# Patient Record
Sex: Female | Born: 1970 | Race: Black or African American | Hispanic: No | Marital: Single
Health system: Midwestern US, Academic
[De-identification: ages and names within clinical notes are randomized; demographics above are authoritative.]

---

## 2017-01-14 ENCOUNTER — Encounter: Admit: 2017-01-14 | Discharge: 2017-01-15

## 2017-04-20 ENCOUNTER — Encounter: Admit: 2017-04-20 | Discharge: 2017-04-20 | Payer: PRIVATE HEALTH INSURANCE

## 2017-04-20 ENCOUNTER — Ambulatory Visit: Admit: 2017-04-20 | Discharge: 2017-04-20 | Payer: MEDICAID

## 2017-04-20 DIAGNOSIS — K319 Disease of stomach and duodenum, unspecified: Principal | ICD-10-CM

## 2017-04-20 DIAGNOSIS — K5900 Constipation, unspecified: Principal | ICD-10-CM

## 2017-04-20 DIAGNOSIS — M6289 Other specified disorders of muscle: ICD-10-CM

## 2017-04-20 DIAGNOSIS — M549 Dorsalgia, unspecified: ICD-10-CM

## 2017-04-20 MED ORDER — SODIUM CHLORIDE 0.9 % IV SOLP
INTRAVENOUS | 0 refills | Status: CN
Start: 2017-04-20 — End: ?

## 2017-04-20 MED ORDER — DOCUSATE SODIUM 100 MG PO CAP
200 mg | ORAL_CAPSULE | Freq: Two times a day (BID) | ORAL | 5 refills | Status: AC
Start: 2017-04-20 — End: 2018-06-07

## 2017-04-20 MED ORDER — PEG-ELECTROLYTE SOLN 420 GRAM PO SOLR
0 refills | Status: SS
Start: 2017-04-20 — End: 2018-02-09

## 2017-05-13 ENCOUNTER — Ambulatory Visit: Admit: 2017-05-13 | Discharge: 2017-05-13 | Payer: MEDICAID

## 2017-05-13 ENCOUNTER — Encounter: Admit: 2017-05-13 | Discharge: 2017-05-13 | Payer: PRIVATE HEALTH INSURANCE

## 2017-05-13 DIAGNOSIS — K5900 Constipation, unspecified: Principal | ICD-10-CM

## 2017-05-13 MED ORDER — IOTHALAMATE MEGLUMINE 60 % IJ SOLN
10 mL | Freq: Once | 0 refills | Status: CP
Start: 2017-05-13 — End: ?
  Administered 2017-05-13: 19:00:00 10 mL

## 2017-05-13 MED ORDER — BARIUM SULFATE 40 % (W/V) PO SUSP
240 mL | Freq: Once | ORAL | 0 refills | Status: CP
Start: 2017-05-13 — End: ?
  Administered 2017-05-13: 19:00:00 240 mL via ORAL

## 2017-05-13 MED ORDER — BARIUM SULFATE 60 % PO CREA
180 mL | Freq: Once | RECTAL | 0 refills | Status: CP
Start: 2017-05-13 — End: ?
  Administered 2017-05-13: 19:00:00 180 mL via RECTAL

## 2017-05-21 ENCOUNTER — Encounter: Admit: 2017-05-21 | Discharge: 2017-05-21 | Payer: PRIVATE HEALTH INSURANCE

## 2017-05-28 ENCOUNTER — Encounter: Admit: 2017-05-28 | Discharge: 2017-05-28 | Payer: PRIVATE HEALTH INSURANCE

## 2017-06-01 ENCOUNTER — Encounter: Admit: 2017-06-01 | Discharge: 2017-06-01 | Payer: PRIVATE HEALTH INSURANCE

## 2017-06-08 ENCOUNTER — Encounter: Admit: 2017-06-08 | Discharge: 2017-06-08 | Payer: PRIVATE HEALTH INSURANCE

## 2017-06-16 ENCOUNTER — Encounter: Admit: 2017-06-16 | Discharge: 2017-06-16 | Payer: PRIVATE HEALTH INSURANCE

## 2017-06-18 ENCOUNTER — Ambulatory Visit: Admit: 2017-06-18 | Discharge: 2017-06-18 | Payer: MEDICAID

## 2017-06-18 ENCOUNTER — Encounter: Admit: 2017-06-18 | Discharge: 2017-06-18 | Payer: PRIVATE HEALTH INSURANCE

## 2017-06-18 DIAGNOSIS — K6289 Other specified diseases of anus and rectum: ICD-10-CM

## 2017-06-18 DIAGNOSIS — K5900 Constipation, unspecified: Principal | ICD-10-CM

## 2017-06-22 ENCOUNTER — Encounter: Admit: 2017-06-22 | Discharge: 2017-06-22 | Payer: PRIVATE HEALTH INSURANCE

## 2017-06-22 DIAGNOSIS — K5900 Constipation, unspecified: Principal | ICD-10-CM

## 2017-06-22 MED ORDER — SODIUM CHLORIDE 0.9 % IV SOLP
INTRAVENOUS | 0 refills | Status: CN
Start: 2017-06-22 — End: ?

## 2017-06-24 ENCOUNTER — Encounter: Admit: 2017-06-24 | Discharge: 2017-06-24 | Payer: PRIVATE HEALTH INSURANCE

## 2017-06-25 MED ORDER — PLECANATIDE 3 MG PO TAB
3 mg | ORAL_TABLET | Freq: Every day | ORAL | 3 refills | Status: AC
Start: 2017-06-25 — End: 2018-02-10

## 2017-06-30 ENCOUNTER — Encounter: Admit: 2017-06-30 | Discharge: 2017-06-30 | Payer: PRIVATE HEALTH INSURANCE

## 2017-07-23 ENCOUNTER — Encounter: Admit: 2017-07-23 | Discharge: 2017-07-23 | Payer: PRIVATE HEALTH INSURANCE

## 2017-07-23 ENCOUNTER — Ambulatory Visit: Admit: 2017-07-23 | Discharge: 2017-07-23 | Payer: PRIVATE HEALTH INSURANCE

## 2017-07-23 ENCOUNTER — Ambulatory Visit: Admit: 2017-07-23 | Discharge: 2017-07-23 | Payer: MEDICAID

## 2017-07-23 DIAGNOSIS — K644 Residual hemorrhoidal skin tags: ICD-10-CM

## 2017-07-23 DIAGNOSIS — L905 Scar conditions and fibrosis of skin: ICD-10-CM

## 2017-07-23 DIAGNOSIS — K648 Other hemorrhoids: ICD-10-CM

## 2017-07-23 DIAGNOSIS — D128 Benign neoplasm of rectum: Principal | ICD-10-CM

## 2017-07-23 DIAGNOSIS — K5900 Constipation, unspecified: ICD-10-CM

## 2017-07-23 MED ORDER — PROPOFOL INJ 10 MG/ML IV VIAL
0 refills | Status: DC
Start: 2017-07-23 — End: 2017-07-23
  Administered 2017-07-23: 19:00:00 100 mg via INTRAVENOUS

## 2017-07-23 MED ORDER — FENTANYL CITRATE (PF) 50 MCG/ML IJ SOLN
25 ug | INTRAVENOUS | 0 refills | Status: DC | PRN
Start: 2017-07-23 — End: 2017-07-23

## 2017-07-23 MED ORDER — ONDANSETRON HCL (PF) 4 MG/2 ML IJ SOLN
4 mg | Freq: Once | INTRAVENOUS | 0 refills | Status: DC | PRN
Start: 2017-07-23 — End: 2017-07-23

## 2017-07-23 MED ORDER — PROPOFOL 10 MG/ML IV EMUL 20 ML (INFUSION)(AM)(OR)
INTRAVENOUS | 0 refills | Status: DC
Start: 2017-07-23 — End: 2017-07-23
  Administered 2017-07-23: 19:00:00 150 ug/kg/min via INTRAVENOUS

## 2017-07-23 MED ORDER — LIDOCAINE (PF) 10 MG/ML (1 %) IJ SOLN
.1-2 mL | INTRAMUSCULAR | 0 refills | Status: DC | PRN
Start: 2017-07-23 — End: 2017-07-23

## 2017-07-23 MED ORDER — SODIUM CHLORIDE 0.9 % IV SOLP
INTRAVENOUS | 0 refills | Status: DC
Start: 2017-07-23 — End: 2017-07-23

## 2017-07-23 MED ORDER — LIDOCAINE (PF) 200 MG/10 ML (2 %) IJ SYRG
0 refills | Status: DC
Start: 2017-07-23 — End: 2017-07-23
  Administered 2017-07-23: 19:00:00 50 mg via INTRAVENOUS

## 2017-07-23 MED ORDER — LACTATED RINGERS IV SOLP
1000 mL | INTRAVENOUS | 0 refills | Status: DC
Start: 2017-07-23 — End: 2017-07-23
  Administered 2017-07-23: 18:00:00 1000 mL via INTRAVENOUS

## 2017-07-23 MED ADMIN — LIDOCAINE (PF) 10 MG/ML (1 %) IJ SOLN [95838]: 0.2 mL | INTRAMUSCULAR | @ 18:00:00 | Stop: 2017-07-23 | NDC 63323049227

## 2017-07-24 ENCOUNTER — Encounter: Admit: 2017-07-24 | Discharge: 2017-07-24 | Payer: PRIVATE HEALTH INSURANCE

## 2017-07-24 DIAGNOSIS — M549 Dorsalgia, unspecified: ICD-10-CM

## 2017-07-24 DIAGNOSIS — K319 Disease of stomach and duodenum, unspecified: Principal | ICD-10-CM

## 2017-07-29 ENCOUNTER — Encounter: Admit: 2017-07-29 | Discharge: 2017-07-29 | Payer: PRIVATE HEALTH INSURANCE

## 2017-07-30 ENCOUNTER — Encounter: Admit: 2017-07-30 | Discharge: 2017-07-30 | Payer: PRIVATE HEALTH INSURANCE

## 2017-07-30 DIAGNOSIS — M549 Dorsalgia, unspecified: ICD-10-CM

## 2017-07-30 DIAGNOSIS — K319 Disease of stomach and duodenum, unspecified: Principal | ICD-10-CM

## 2017-08-07 ENCOUNTER — Encounter: Admit: 2017-08-07 | Discharge: 2017-08-07 | Payer: PRIVATE HEALTH INSURANCE

## 2017-09-01 ENCOUNTER — Encounter: Admit: 2017-09-01 | Discharge: 2017-09-01 | Payer: PRIVATE HEALTH INSURANCE

## 2017-09-09 ENCOUNTER — Encounter: Admit: 2017-09-09 | Discharge: 2017-09-09 | Payer: PRIVATE HEALTH INSURANCE

## 2017-09-09 ENCOUNTER — Encounter: Admit: 2017-09-09 | Discharge: 2017-09-09 | Payer: MEDICAID

## 2017-09-09 DIAGNOSIS — J449 Chronic obstructive pulmonary disease, unspecified: ICD-10-CM

## 2017-09-09 DIAGNOSIS — M6289 Other specified disorders of muscle: ICD-10-CM

## 2017-09-09 DIAGNOSIS — M549 Dorsalgia, unspecified: ICD-10-CM

## 2017-09-09 DIAGNOSIS — K5900 Constipation, unspecified: Principal | ICD-10-CM

## 2017-09-09 DIAGNOSIS — K319 Disease of stomach and duodenum, unspecified: Principal | ICD-10-CM

## 2017-09-18 ENCOUNTER — Encounter: Admit: 2017-09-18 | Discharge: 2017-09-19

## 2017-10-12 ENCOUNTER — Encounter: Admit: 2017-10-12 | Discharge: 2017-10-12 | Payer: PRIVATE HEALTH INSURANCE

## 2017-11-06 ENCOUNTER — Encounter: Admit: 2017-11-06 | Discharge: 2017-11-06 | Payer: PRIVATE HEALTH INSURANCE

## 2017-11-09 ENCOUNTER — Encounter: Admit: 2017-11-09 | Discharge: 2017-11-09 | Payer: PRIVATE HEALTH INSURANCE

## 2017-11-25 ENCOUNTER — Encounter: Admit: 2017-11-25 | Discharge: 2017-11-25 | Payer: PRIVATE HEALTH INSURANCE

## 2017-11-25 ENCOUNTER — Encounter: Admit: 2017-11-25 | Discharge: 2017-11-25 | Payer: MEDICAID

## 2017-11-25 DIAGNOSIS — M6289 Other specified disorders of muscle: Principal | ICD-10-CM

## 2017-11-25 DIAGNOSIS — M549 Dorsalgia, unspecified: ICD-10-CM

## 2017-11-25 DIAGNOSIS — K319 Disease of stomach and duodenum, unspecified: Principal | ICD-10-CM

## 2017-11-25 DIAGNOSIS — J449 Chronic obstructive pulmonary disease, unspecified: ICD-10-CM

## 2017-12-24 ENCOUNTER — Ambulatory Visit: Admit: 2017-12-24 | Discharge: 2017-12-24 | Payer: MEDICAID

## 2017-12-24 DIAGNOSIS — M6289 Other specified disorders of muscle: Principal | ICD-10-CM

## 2018-01-02 ENCOUNTER — Encounter: Admit: 2018-01-02 | Discharge: 2018-01-02

## 2018-01-05 ENCOUNTER — Encounter: Admit: 2018-01-05 | Discharge: 2018-01-05 | Payer: PRIVATE HEALTH INSURANCE

## 2018-01-06 ENCOUNTER — Encounter: Admit: 2018-01-06 | Discharge: 2018-01-06 | Payer: PRIVATE HEALTH INSURANCE

## 2018-01-12 ENCOUNTER — Encounter: Admit: 2018-01-12 | Discharge: 2018-01-12 | Payer: PRIVATE HEALTH INSURANCE

## 2018-01-20 ENCOUNTER — Encounter: Admit: 2018-01-20 | Discharge: 2018-01-20 | Payer: PRIVATE HEALTH INSURANCE

## 2018-01-20 ENCOUNTER — Encounter: Admit: 2018-01-20 | Discharge: 2018-01-20 | Payer: MEDICAID

## 2018-01-20 DIAGNOSIS — M549 Dorsalgia, unspecified: ICD-10-CM

## 2018-01-20 DIAGNOSIS — K5902 Outlet dysfunction constipation: ICD-10-CM

## 2018-01-20 DIAGNOSIS — M6289 Other specified disorders of muscle: Principal | ICD-10-CM

## 2018-01-20 DIAGNOSIS — N816 Rectocele: ICD-10-CM

## 2018-01-20 DIAGNOSIS — K319 Disease of stomach and duodenum, unspecified: Principal | ICD-10-CM

## 2018-01-20 DIAGNOSIS — J449 Chronic obstructive pulmonary disease, unspecified: ICD-10-CM

## 2018-01-20 MED ORDER — ONDANSETRON 8 MG PO TBDI
8 mg | ORAL_TABLET | ORAL | 0 refills | 8.00000 days | Status: AC | PRN
Start: 2018-01-20 — End: 2018-03-24

## 2018-01-20 MED ORDER — METRONIDAZOLE 500 MG PO TAB
ORAL_TABLET | 0 refills | Status: AC
Start: 2018-01-20 — End: 2018-02-10

## 2018-01-20 MED ORDER — NEOMYCIN 500 MG PO TAB
ORAL_TABLET | 0 refills | Status: AC
Start: 2018-01-20 — End: 2018-02-10

## 2018-01-21 ENCOUNTER — Encounter: Admit: 2018-01-21 | Discharge: 2018-01-21 | Payer: PRIVATE HEALTH INSURANCE

## 2018-01-21 DIAGNOSIS — N816 Rectocele: Principal | ICD-10-CM

## 2018-01-21 MED ORDER — SODIUM CHLORIDE 0.9 % IV SOLP
250 mL | INTRAVENOUS | 0 refills | Status: CN
Start: 2018-01-21 — End: ?

## 2018-01-22 ENCOUNTER — Encounter: Admit: 2018-01-22 | Discharge: 2018-01-22 | Payer: PRIVATE HEALTH INSURANCE

## 2018-01-22 DIAGNOSIS — J449 Chronic obstructive pulmonary disease, unspecified: ICD-10-CM

## 2018-01-22 DIAGNOSIS — M549 Dorsalgia, unspecified: ICD-10-CM

## 2018-01-22 DIAGNOSIS — K319 Disease of stomach and duodenum, unspecified: Principal | ICD-10-CM

## 2018-02-08 ENCOUNTER — Encounter: Admit: 2018-02-08 | Discharge: 2018-02-08 | Payer: PRIVATE HEALTH INSURANCE

## 2018-02-08 ENCOUNTER — Inpatient Hospital Stay: Admit: 2018-02-08 | Discharge: 2018-02-08 | Payer: PRIVATE HEALTH INSURANCE

## 2018-02-08 DIAGNOSIS — K561 Intussusception: Principal | ICD-10-CM

## 2018-02-08 DIAGNOSIS — K319 Disease of stomach and duodenum, unspecified: Principal | ICD-10-CM

## 2018-02-08 DIAGNOSIS — M549 Dorsalgia, unspecified: ICD-10-CM

## 2018-02-08 DIAGNOSIS — J449 Chronic obstructive pulmonary disease, unspecified: ICD-10-CM

## 2018-02-08 LAB — BASIC METABOLIC PANEL
Lab: 105 MMOL/L (ref 98–110)
Lab: 141 MMOL/L (ref 137–147)
Lab: 16 mg/dL (ref 7–25)
Lab: 27 MMOL/L (ref 21–30)
Lab: 3.7 MMOL/L (ref 3.5–5.1)
Lab: 9 pg (ref 3–12)
Lab: 90 mg/dL (ref 70–100)

## 2018-02-08 LAB — CBC: Lab: 6.9 K/UL (ref 4.5–11.0)

## 2018-02-08 MED ORDER — PROMETHAZINE 25 MG/ML IJ SOLN
6.25 mg | INTRAVENOUS | 0 refills | Status: DC | PRN
Start: 2018-02-08 — End: 2018-02-08

## 2018-02-08 MED ORDER — LIDOCAINE (PF) 10 MG/ML (1 %) IJ SOLN
.1-2 mL | INTRAMUSCULAR | 0 refills | Status: DC | PRN
Start: 2018-02-08 — End: 2018-02-08

## 2018-02-08 MED ORDER — DEXTRAN 70-HYPROMELLOSE (PF) 0.1-0.3 % OP DPET
0 refills | Status: DC
Start: 2018-02-08 — End: 2018-02-08
  Administered 2018-02-08: 13:00:00 2 [drp] via OPHTHALMIC

## 2018-02-08 MED ORDER — LIDOCAINE-EPINEPHRINE 1 %-1:100,000 IJ SOLN
0 refills | Status: DC
Start: 2018-02-08 — End: 2018-02-08
  Administered 2018-02-08: 15:00:00 3 mL via INTRAMUSCULAR

## 2018-02-08 MED ORDER — ACETAMINOPHEN 325 MG PO TAB
650 mg | ORAL | 0 refills | Status: CN | PRN
Start: 2018-02-08 — End: ?

## 2018-02-08 MED ORDER — HELP MEDICATION
Freq: Every day | ORAL | 0 refills | Status: DC
Start: 2018-02-08 — End: 2018-02-09

## 2018-02-08 MED ORDER — SUGAMMADEX 100 MG/ML IV SOLN
INTRAVENOUS | 0 refills | Status: DC
Start: 2018-02-08 — End: 2018-02-08
  Administered 2018-02-08: 18:00:00 190 mg via INTRAVENOUS

## 2018-02-08 MED ORDER — HYDROMORPHONE (PF) 2 MG/ML IJ SYRG
0 refills | Status: DC
Start: 2018-02-08 — End: 2018-02-08
  Administered 2018-02-08 (×2): .4 mg via INTRAVENOUS
  Administered 2018-02-08: 17:00:00 .2 mg via INTRAVENOUS
  Administered 2018-02-08: 15:00:00 .4 mg via INTRAVENOUS

## 2018-02-08 MED ORDER — FENTANYL PCA 550 MCG/55 ML SYR (STD CONC)(ADULT)(PREMADE)
INTRAVENOUS | 0 refills | Status: DC
Start: 2018-02-08 — End: 2018-02-09
  Administered 2018-02-08: 19:00:00 55.000 mL via INTRAVENOUS

## 2018-02-08 MED ORDER — ONDANSETRON HCL (PF) 4 MG/2 ML IJ SOLN
4 mg | INTRAVENOUS | 0 refills | Status: DC | PRN
Start: 2018-02-08 — End: 2018-02-10

## 2018-02-08 MED ORDER — HALOPERIDOL LACTATE 5 MG/ML IJ SOLN
1 mg | Freq: Once | INTRAVENOUS | 0 refills | Status: DC | PRN
Start: 2018-02-08 — End: 2018-02-08

## 2018-02-08 MED ORDER — IPRATROPIUM-ALBUTEROL 20-100 MCG/ACTUATION IN MIST
1 | Freq: Every day | RESPIRATORY_TRACT | 0 refills | Status: DC
Start: 2018-02-08 — End: 2018-02-08

## 2018-02-08 MED ORDER — ENOXAPARIN 40 MG/0.4 ML SC SYRG
40 mg | Freq: Every day | SUBCUTANEOUS | 0 refills | Status: DC
Start: 2018-02-08 — End: 2018-02-10
  Administered 2018-02-08 – 2018-02-10 (×2): 40 mg via SUBCUTANEOUS

## 2018-02-08 MED ORDER — LACTATED RINGERS IV SOLP
INTRAVENOUS | 0 refills | Status: DC
Start: 2018-02-08 — End: 2018-02-09
  Administered 2018-02-08 – 2018-02-09 (×3): 1000.000 mL via INTRAVENOUS

## 2018-02-08 MED ORDER — PROPOFOL 10 MG/ML IV EMUL (INFUSION)(AM)(OR)
0 refills | Status: DC
Start: 2018-02-08 — End: 2018-02-08
  Administered 2018-02-08: 13:00:00 150 ug/kg/min via INTRAVENOUS
  Administered 2018-02-08 (×3): 100.000 mL via INTRAVENOUS

## 2018-02-08 MED ORDER — GABAPENTIN 300 MG PO CAP
600 mg | Freq: Once | ORAL | 0 refills | Status: CP
Start: 2018-02-08 — End: ?
  Administered 2018-02-08: 12:00:00 600 mg via ORAL

## 2018-02-08 MED ORDER — ONDANSETRON HCL (PF) 4 MG/2 ML IJ SOLN
INTRAVENOUS | 0 refills | Status: DC
Start: 2018-02-08 — End: 2018-02-08
  Administered 2018-02-08: 17:00:00 4 mg via INTRAVENOUS

## 2018-02-08 MED ORDER — CELECOXIB 200 MG PO CAP
200 mg | Freq: Once | ORAL | 0 refills | Status: CP
Start: 2018-02-08 — End: ?
  Administered 2018-02-08: 12:00:00 200 mg via ORAL

## 2018-02-08 MED ORDER — DEXTROSE 5%-0.45% SODIUM CHLORIDE & POTASSIUM CHLORIDE 20 MEQ/L IV SOLP
INTRAVENOUS | 0 refills | Status: DC
Start: 2018-02-08 — End: 2018-02-09

## 2018-02-08 MED ORDER — PANTOPRAZOLE 40 MG PO TBEC
40 mg | Freq: Once | ORAL | 0 refills | Status: CP
Start: 2018-02-08 — End: ?
  Administered 2018-02-08: 12:00:00 40 mg via ORAL

## 2018-02-08 MED ORDER — PHENOL 1.4 % MM SPRA
2 | OROMUCOSAL | 0 refills | Status: DC | PRN
Start: 2018-02-08 — End: 2018-02-10

## 2018-02-08 MED ORDER — CEFOXITIN INJ 2GM IVP
2 g | Freq: Once | INTRAVENOUS | 0 refills | Status: DC
Start: 2018-02-08 — End: 2018-02-08

## 2018-02-08 MED ORDER — DOCUSATE SODIUM 100 MG PO CAP
200 mg | Freq: Two times a day (BID) | ORAL | 0 refills | Status: DC
Start: 2018-02-08 — End: 2018-02-10
  Administered 2018-02-09 – 2018-02-10 (×4): 200 mg via ORAL

## 2018-02-08 MED ORDER — CELECOXIB 100 MG PO CAP
200 mg | Freq: Every day | ORAL | 0 refills | Status: DC
Start: 2018-02-08 — End: 2018-02-10
  Administered 2018-02-09 – 2018-02-10 (×2): 200 mg via ORAL

## 2018-02-08 MED ORDER — CEFOXITIN IVPB
2 g | Freq: Once | INTRAVENOUS | 0 refills | Status: CP
Start: 2018-02-08 — End: ?

## 2018-02-08 MED ORDER — NALOXONE 0.4 MG/ML IJ SOLN
.08 mg | INTRAVENOUS | 0 refills | Status: DC | PRN
Start: 2018-02-08 — End: 2018-02-10

## 2018-02-08 MED ORDER — FENTANYL CITRATE (PF) 50 MCG/ML IJ SOLN
50 ug | INTRAVENOUS | 0 refills | Status: DC | PRN
Start: 2018-02-08 — End: 2018-02-08
  Administered 2018-02-08: 18:00:00 50 ug via INTRAVENOUS

## 2018-02-08 MED ORDER — HYDRALAZINE 20 MG/ML IJ SOLN
0 refills | Status: DC
Start: 2018-02-08 — End: 2018-02-08
  Administered 2018-02-08 (×2): 5 mg via INTRAVENOUS

## 2018-02-08 MED ORDER — ROCURONIUM 10 MG/ML IV SOLN
INTRAVENOUS | 0 refills | Status: DC
Start: 2018-02-08 — End: 2018-02-08
  Administered 2018-02-08: 13:00:00 50 mg via INTRAVENOUS
  Administered 2018-02-08: 16:00:00 20 mg via INTRAVENOUS
  Administered 2018-02-08 (×3): 10 mg via INTRAVENOUS
  Administered 2018-02-08: 15:00:00 30 mg via INTRAVENOUS
  Administered 2018-02-08 (×2): 20 mg via INTRAVENOUS

## 2018-02-08 MED ORDER — FENTANYL CITRATE (PF) 50 MCG/ML IJ SOLN
0 refills | Status: DC
Start: 2018-02-08 — End: 2018-02-08
  Administered 2018-02-08 (×3): 50 ug via INTRAVENOUS
  Administered 2018-02-08: 13:00:00 100 ug via INTRAVENOUS

## 2018-02-08 MED ORDER — ACETAMINOPHEN 500 MG PO TAB
1000 mg | Freq: Once | ORAL | 0 refills | Status: CP
Start: 2018-02-08 — End: ?

## 2018-02-08 MED ORDER — DEXAMETHASONE SODIUM PHOSPHATE 4 MG/ML IJ SOLN
INTRAVENOUS | 0 refills | Status: DC
Start: 2018-02-08 — End: 2018-02-08
  Administered 2018-02-08: 13:00:00 4 mg via INTRAVENOUS

## 2018-02-08 MED ORDER — LACTATED RINGERS IV SOLP
0 refills | Status: DC
Start: 2018-02-08 — End: 2018-02-08
  Administered 2018-02-08: 14:00:00 via INTRAVENOUS

## 2018-02-08 MED ORDER — PANTOPRAZOLE 40 MG PO TBEC
40 mg | Freq: Every day | ORAL | 0 refills | Status: DC
Start: 2018-02-08 — End: 2018-02-10
  Administered 2018-02-09 – 2018-02-10 (×2): 40 mg via ORAL

## 2018-02-08 MED ORDER — HYDROMORPHONE (PF) 2 MG/ML IJ SYRG
.5 mg | INTRAVENOUS | 0 refills | Status: DC | PRN
Start: 2018-02-08 — End: 2018-02-08

## 2018-02-08 MED ORDER — PHENYLEPHRINE IN 0.9% NACL(PF) 1 MG/10 ML (100 MCG/ML) IV SYRG
INTRAVENOUS | 0 refills | Status: DC
Start: 2018-02-08 — End: 2018-02-08
  Administered 2018-02-08 (×2): 100 ug via INTRAVENOUS

## 2018-02-08 MED ORDER — LIDOCAINE (PF) 200 MG/10 ML (2 %) IJ SYRG
0 refills | Status: DC
Start: 2018-02-08 — End: 2018-02-08
  Administered 2018-02-08: 13:00:00 80 mg via INTRAVENOUS

## 2018-02-08 MED ORDER — BUPIVACAINE 0.25 % (2.5 MG/ML) IJ SOLN
0 refills | Status: DC
Start: 2018-02-08 — End: 2018-02-08
  Administered 2018-02-08: 15:00:00 3 mL via INTRAMUSCULAR

## 2018-02-08 MED ORDER — SODIUM CHLORIDE 0.9 % IV SOLP
250 mL | INTRAVENOUS | 0 refills | Status: DC
Start: 2018-02-08 — End: 2018-02-09
  Administered 2018-02-08: 11:00:00 250 mL via INTRAVENOUS

## 2018-02-08 MED ORDER — FLUTICASONE PROPION-SALMETEROL 500-50 MCG/DOSE IN DSDV
1 | Freq: Two times a day (BID) | RESPIRATORY_TRACT | 0 refills | Status: DC
Start: 2018-02-08 — End: 2018-02-10
  Administered 2018-02-08: 22:00:00 1 via RESPIRATORY_TRACT

## 2018-02-08 MED ORDER — FENTANYL CITRATE (PF) 50 MCG/ML IJ SOLN
25 ug | INTRAVENOUS | 0 refills | Status: DC | PRN
Start: 2018-02-08 — End: 2018-02-08
  Administered 2018-02-08 (×2): 25 ug via INTRAVENOUS

## 2018-02-08 MED ORDER — MIDAZOLAM 1 MG/ML IJ SOLN
INTRAVENOUS | 0 refills | Status: DC
Start: 2018-02-08 — End: 2018-02-08
  Administered 2018-02-08: 13:00:00 2 mg via INTRAVENOUS

## 2018-02-08 MED ORDER — IPRATROPIUM-ALBUTEROL 20-100 MCG/ACTUATION IN MIST
1 | RESPIRATORY_TRACT | 0 refills | Status: DC | PRN
Start: 2018-02-08 — End: 2018-02-10

## 2018-02-08 MED ORDER — ELECTROLYTE-A IV SOLP
0 refills | Status: DC
Start: 2018-02-08 — End: 2018-02-08
  Administered 2018-02-08: 13:00:00 via INTRAVENOUS

## 2018-02-08 MED ORDER — CEFOXITIN INJ 2GM IVP
2 g | INTRAVENOUS | 0 refills | Status: CP
Start: 2018-02-08 — End: ?
  Administered 2018-02-08 – 2018-02-09 (×3): 2 g via INTRAVENOUS

## 2018-02-08 MED ORDER — KETAMINE 10 MG/ML IJ SOLN
0 refills | Status: DC
Start: 2018-02-08 — End: 2018-02-08
  Administered 2018-02-08: 14:00:00 30 mg via INTRAVENOUS

## 2018-02-08 MED ORDER — PROPOFOL INJ 10 MG/ML IV VIAL
0 refills | Status: DC
Start: 2018-02-08 — End: 2018-02-08
  Administered 2018-02-08: 14:00:00 40 mg via INTRAVENOUS
  Administered 2018-02-08: 13:00:00 160 mg via INTRAVENOUS

## 2018-02-09 LAB — BASIC METABOLIC PANEL: Lab: 142 MMOL/L — ABNORMAL LOW (ref 137–147)

## 2018-02-09 LAB — CBC: Lab: 8.4 K/UL — ABNORMAL LOW (ref >60)

## 2018-02-09 LAB — MAGNESIUM: Lab: 2 mg/dL — ABNORMAL LOW (ref >60)

## 2018-02-09 LAB — PHOSPHORUS: Lab: 3.7 mg/dL — ABNORMAL LOW (ref >60)

## 2018-02-09 MED ORDER — OXYCODONE 5 MG PO TAB
5-10 mg | ORAL | 0 refills | Status: DC | PRN
Start: 2018-02-09 — End: 2018-02-10
  Administered 2018-02-09 – 2018-02-10 (×4): 10 mg via ORAL

## 2018-02-09 MED ORDER — SIMETHICONE 80 MG PO CHEW
80 mg | ORAL | 0 refills | Status: DC | PRN
Start: 2018-02-09 — End: 2018-02-10
  Administered 2018-02-09 – 2018-02-10 (×2): 80 mg via ORAL

## 2018-02-09 MED ORDER — FENTANYL CITRATE (PF) 50 MCG/ML IJ SOLN
25-50 ug | INTRAVENOUS | 0 refills | Status: DC | PRN
Start: 2018-02-09 — End: 2018-02-10

## 2018-02-09 MED ORDER — OXYCODONE 5 MG PO TAB
5-10 mg | ORAL_TABLET | ORAL | 0 refills | 6.00000 days | Status: AC | PRN
Start: 2018-02-09 — End: 2018-03-24
  Filled 2018-02-10 (×2): qty 30, 3d supply, fill #1

## 2018-02-09 MED ORDER — ACETAMINOPHEN 325 MG PO TAB
650 mg | ORAL | 0 refills | Status: DC
Start: 2018-02-09 — End: 2018-02-10
  Administered 2018-02-09 – 2018-02-10 (×7): 650 mg via ORAL

## 2018-02-09 MED ORDER — ACETAMINOPHEN 325 MG PO TAB
650 mg | ORAL | 0 refills | Status: SS | PRN
Start: 2018-02-09 — End: 2018-04-16

## 2018-02-10 ENCOUNTER — Inpatient Hospital Stay: Admit: 2018-02-08 | Discharge: 2018-02-10 | Disposition: A | Discharge status: Home / Self Care | Payer: MEDICAID

## 2018-02-10 ENCOUNTER — Encounter: Admit: 2018-02-10 | Discharge: 2018-02-10 | Payer: PRIVATE HEALTH INSURANCE

## 2018-02-10 DIAGNOSIS — K561 Intussusception: Principal | ICD-10-CM

## 2018-02-10 DIAGNOSIS — K648 Other hemorrhoids: ICD-10-CM

## 2018-02-10 DIAGNOSIS — N816 Rectocele: ICD-10-CM

## 2018-02-10 DIAGNOSIS — K621 Rectal polyp: ICD-10-CM

## 2018-02-10 DIAGNOSIS — K5900 Constipation, unspecified: ICD-10-CM

## 2018-02-10 DIAGNOSIS — K319 Disease of stomach and duodenum, unspecified: Principal | ICD-10-CM

## 2018-02-10 DIAGNOSIS — K644 Residual hemorrhoidal skin tags: ICD-10-CM

## 2018-02-10 DIAGNOSIS — J449 Chronic obstructive pulmonary disease, unspecified: ICD-10-CM

## 2018-02-10 DIAGNOSIS — M549 Dorsalgia, unspecified: ICD-10-CM

## 2018-02-10 LAB — CBC
Lab: 3.9 M/UL — ABNORMAL LOW (ref 4.0–5.0)
Lab: 7 K/UL — ABNORMAL LOW (ref 4.5–11.0)

## 2018-02-10 LAB — PHOSPHORUS: Lab: 4 mg/dL — ABNORMAL LOW (ref >60)

## 2018-02-10 LAB — MAGNESIUM: Lab: 1.8 mg/dL — ABNORMAL LOW (ref >60)

## 2018-02-10 LAB — BASIC METABOLIC PANEL: Lab: 4.1 MMOL/L — ABNORMAL LOW (ref 3.5–5.1)

## 2018-02-10 MED ORDER — POLYETHYLENE GLYCOL 3350 17 GRAM PO PWPK
17 g | Freq: Every day | ORAL | 0 refills | 18.00000 days | Status: DC | PRN
Start: 2018-02-10 — End: 2018-03-24

## 2018-02-11 ENCOUNTER — Encounter: Admit: 2018-02-11 | Discharge: 2018-02-11 | Payer: PRIVATE HEALTH INSURANCE

## 2018-02-11 DIAGNOSIS — M549 Dorsalgia, unspecified: ICD-10-CM

## 2018-02-11 DIAGNOSIS — J449 Chronic obstructive pulmonary disease, unspecified: ICD-10-CM

## 2018-02-11 DIAGNOSIS — K319 Disease of stomach and duodenum, unspecified: Principal | ICD-10-CM

## 2018-02-18 ENCOUNTER — Encounter: Admit: 2018-02-18 | Discharge: 2018-02-18 | Payer: PRIVATE HEALTH INSURANCE

## 2018-02-19 ENCOUNTER — Encounter: Admit: 2018-02-19 | Discharge: 2018-02-19 | Payer: PRIVATE HEALTH INSURANCE

## 2018-03-11 ENCOUNTER — Encounter: Admit: 2018-03-11 | Discharge: 2018-03-11 | Payer: PRIVATE HEALTH INSURANCE

## 2018-03-11 DIAGNOSIS — R350 Frequency of micturition: Principal | ICD-10-CM

## 2018-03-15 ENCOUNTER — Encounter: Admit: 2018-03-15 | Discharge: 2018-03-15 | Payer: PRIVATE HEALTH INSURANCE

## 2018-03-16 ENCOUNTER — Encounter: Admit: 2018-03-16 | Discharge: 2018-03-16 | Payer: PRIVATE HEALTH INSURANCE

## 2018-03-17 ENCOUNTER — Encounter: Admit: 2018-03-17 | Discharge: 2018-03-17 | Payer: MEDICAID

## 2018-03-17 ENCOUNTER — Encounter: Admit: 2018-03-17 | Discharge: 2018-03-17 | Payer: PRIVATE HEALTH INSURANCE

## 2018-03-17 DIAGNOSIS — R350 Frequency of micturition: Principal | ICD-10-CM

## 2018-03-17 DIAGNOSIS — M549 Dorsalgia, unspecified: ICD-10-CM

## 2018-03-17 DIAGNOSIS — K319 Disease of stomach and duodenum, unspecified: Principal | ICD-10-CM

## 2018-03-17 DIAGNOSIS — J449 Chronic obstructive pulmonary disease, unspecified: ICD-10-CM

## 2018-03-17 DIAGNOSIS — K561 Intussusception: ICD-10-CM

## 2018-03-17 MED ORDER — LINACLOTIDE 290 MCG PO CAP
290 ug | ORAL_CAPSULE | Freq: Every day | ORAL | 0 refills | 30.00000 days | Status: AC
Start: 2018-03-17 — End: 2018-03-24

## 2018-03-17 MED ORDER — PRUCALOPRIDE 2 MG PO TAB
2 mg | ORAL_TABLET | Freq: Every day | ORAL | 0 refills | 30.00000 days | Status: AC
Start: 2018-03-17 — End: 2018-06-07

## 2018-03-18 ENCOUNTER — Encounter: Admit: 2018-03-18 | Discharge: 2018-03-18 | Payer: PRIVATE HEALTH INSURANCE

## 2018-03-24 ENCOUNTER — Encounter: Admit: 2018-03-24 | Discharge: 2018-03-24 | Payer: PRIVATE HEALTH INSURANCE

## 2018-03-24 DIAGNOSIS — J449 Chronic obstructive pulmonary disease, unspecified: ICD-10-CM

## 2018-03-24 DIAGNOSIS — K648 Other hemorrhoids: Secondary | ICD-10-CM

## 2018-03-24 DIAGNOSIS — K319 Disease of stomach and duodenum, unspecified: Principal | ICD-10-CM

## 2018-03-24 DIAGNOSIS — M549 Dorsalgia, unspecified: ICD-10-CM

## 2018-03-24 MED ORDER — HEMORRHOIDAL SUPP (PHENYLEPHRINE 0.25 %)
1 | Freq: Two times a day (BID) | RECTAL | 1 refills | 6.00000 days | Status: AC
Start: 2018-03-24 — End: 2018-06-07

## 2018-03-24 MED ORDER — RIFAXIMIN 550 MG PO TAB
550 mg | ORAL_TABLET | Freq: Three times a day (TID) | ORAL | 0 refills | 30.00000 days | Status: AC
Start: 2018-03-24 — End: ?

## 2018-03-25 ENCOUNTER — Ambulatory Visit: Admit: 2018-03-24 | Discharge: 2018-03-25 | Payer: MEDICAID

## 2018-03-25 DIAGNOSIS — M6289 Other specified disorders of muscle: Principal | ICD-10-CM

## 2018-03-25 DIAGNOSIS — K9289 Other specified diseases of the digestive system: ICD-10-CM

## 2018-03-25 DIAGNOSIS — R140 Abdominal distension (gaseous): ICD-10-CM

## 2018-03-25 DIAGNOSIS — K5909 Other constipation: ICD-10-CM

## 2018-03-25 DIAGNOSIS — N816 Rectocele: ICD-10-CM

## 2018-03-25 DIAGNOSIS — K625 Hemorrhage of anus and rectum: ICD-10-CM

## 2018-03-25 DIAGNOSIS — K6289 Other specified diseases of anus and rectum: ICD-10-CM

## 2018-03-25 DIAGNOSIS — D128 Benign neoplasm of rectum: ICD-10-CM

## 2018-03-29 ENCOUNTER — Encounter: Admit: 2018-03-29 | Discharge: 2018-03-29 | Payer: PRIVATE HEALTH INSURANCE

## 2018-03-30 ENCOUNTER — Encounter: Admit: 2018-03-30 | Discharge: 2018-03-30 | Payer: PRIVATE HEALTH INSURANCE

## 2018-03-31 ENCOUNTER — Encounter: Admit: 2018-03-31 | Discharge: 2018-03-31 | Payer: PRIVATE HEALTH INSURANCE

## 2018-04-02 MED ORDER — DOXYCYCLINE HYCLATE 100 MG PO TAB
100 mg | ORAL_TABLET | Freq: Two times a day (BID) | ORAL | 0 refills | 8.00000 days | Status: AC
Start: 2018-04-02 — End: 2018-06-07

## 2018-04-02 MED ORDER — COLCHICINE 0.6 MG PO TAB
.6 mg | ORAL_TABLET | Freq: Three times a day (TID) | ORAL | 0 refills | Status: AC
Start: 2018-04-02 — End: 2018-06-07

## 2018-04-05 ENCOUNTER — Encounter: Admit: 2018-04-05 | Discharge: 2018-04-05 | Payer: PRIVATE HEALTH INSURANCE

## 2018-04-06 ENCOUNTER — Encounter: Admit: 2018-04-06 | Discharge: 2018-04-06 | Payer: PRIVATE HEALTH INSURANCE

## 2018-04-07 ENCOUNTER — Encounter: Admit: 2018-04-07 | Discharge: 2018-04-07 | Payer: PRIVATE HEALTH INSURANCE

## 2018-04-07 DIAGNOSIS — R1310 Dysphagia, unspecified: Principal | ICD-10-CM

## 2018-04-07 MED ORDER — SODIUM CHLORIDE 0.9 % IV SOLP
INTRAVENOUS | 0 refills | Status: CN
Start: 2018-04-07 — End: ?

## 2018-04-08 ENCOUNTER — Encounter: Admit: 2018-04-08 | Discharge: 2018-04-08 | Payer: PRIVATE HEALTH INSURANCE

## 2018-04-14 ENCOUNTER — Encounter: Admit: 2018-04-14 | Discharge: 2018-04-14 | Payer: PRIVATE HEALTH INSURANCE

## 2018-04-16 ENCOUNTER — Ambulatory Visit: Admit: 2018-04-16 | Discharge: 2018-04-16 | Payer: PRIVATE HEALTH INSURANCE

## 2018-04-16 ENCOUNTER — Ambulatory Visit: Admit: 2018-04-16 | Discharge: 2018-04-16 | Payer: MEDICAID

## 2018-04-16 ENCOUNTER — Encounter: Admit: 2018-04-16 | Discharge: 2018-04-16 | Payer: PRIVATE HEALTH INSURANCE

## 2018-04-16 DIAGNOSIS — J449 Chronic obstructive pulmonary disease, unspecified: ICD-10-CM

## 2018-04-16 DIAGNOSIS — K317 Polyp of stomach and duodenum: ICD-10-CM

## 2018-04-16 DIAGNOSIS — K222 Esophageal obstruction: ICD-10-CM

## 2018-04-16 DIAGNOSIS — R1310 Dysphagia, unspecified: Principal | ICD-10-CM

## 2018-04-16 MED ORDER — LIDOCAINE (PF) 200 MG/10 ML (2 %) IJ SYRG
0 refills | Status: DC
Start: 2018-04-16 — End: 2018-04-16
  Administered 2018-04-16: 14:00:00 100 mg via INTRAVENOUS

## 2018-04-16 MED ORDER — PROPOFOL INJ 10 MG/ML IV VIAL
0 refills | Status: DC
Start: 2018-04-16 — End: 2018-04-16
  Administered 2018-04-16: 14:00:00 70 mg via INTRAVENOUS
  Administered 2018-04-16 (×2): 30 mg via INTRAVENOUS

## 2018-04-16 MED ORDER — LACTATED RINGERS IV SOLP
0 refills | Status: DC
Start: 2018-04-16 — End: 2018-04-16
  Administered 2018-04-16: 14:00:00 via INTRAVENOUS

## 2018-04-16 MED ORDER — PROPOFOL 10 MG/ML IV EMUL 20 ML (INFUSION)(AM)(OR)
INTRAVENOUS | 0 refills | Status: DC
Start: 2018-04-16 — End: 2018-04-16
  Administered 2018-04-16: 14:00:00 125 ug/kg/min via INTRAVENOUS

## 2018-04-16 MED ORDER — LACTATED RINGERS IV SOLP
1000 mL | Freq: Once | INTRAVENOUS | 0 refills | Status: CP
Start: 2018-04-16 — End: ?
  Administered 2018-04-16: 14:00:00 1000 mL via INTRAVENOUS

## 2018-04-17 ENCOUNTER — Encounter: Admit: 2018-04-17 | Discharge: 2018-04-17 | Payer: PRIVATE HEALTH INSURANCE

## 2018-04-17 DIAGNOSIS — J449 Chronic obstructive pulmonary disease, unspecified: ICD-10-CM

## 2018-04-17 DIAGNOSIS — K319 Disease of stomach and duodenum, unspecified: Principal | ICD-10-CM

## 2018-04-17 DIAGNOSIS — M549 Dorsalgia, unspecified: ICD-10-CM

## 2018-04-19 ENCOUNTER — Encounter: Admit: 2018-04-19 | Discharge: 2018-04-19 | Payer: PRIVATE HEALTH INSURANCE

## 2018-04-20 ENCOUNTER — Encounter: Admit: 2018-04-20 | Discharge: 2018-04-20 | Payer: PRIVATE HEALTH INSURANCE

## 2018-04-22 ENCOUNTER — Encounter: Admit: 2018-04-22 | Discharge: 2018-04-22 | Payer: PRIVATE HEALTH INSURANCE

## 2018-04-26 ENCOUNTER — Encounter: Admit: 2018-04-26 | Discharge: 2018-04-26 | Payer: PRIVATE HEALTH INSURANCE

## 2018-04-27 ENCOUNTER — Encounter: Admit: 2018-04-27 | Discharge: 2018-04-27 | Payer: PRIVATE HEALTH INSURANCE

## 2018-04-30 ENCOUNTER — Encounter: Admit: 2018-04-30 | Discharge: 2018-04-30 | Payer: PRIVATE HEALTH INSURANCE

## 2018-05-05 ENCOUNTER — Encounter: Admit: 2018-05-05 | Discharge: 2018-05-05 | Payer: PRIVATE HEALTH INSURANCE

## 2018-05-06 ENCOUNTER — Encounter: Admit: 2018-05-06 | Discharge: 2018-05-06 | Payer: PRIVATE HEALTH INSURANCE

## 2018-05-13 ENCOUNTER — Encounter: Admit: 2018-05-13 | Discharge: 2018-05-13 | Payer: PRIVATE HEALTH INSURANCE

## 2018-06-07 ENCOUNTER — Encounter: Admit: 2018-06-07 | Discharge: 2018-06-07 | Payer: PRIVATE HEALTH INSURANCE

## 2018-06-07 ENCOUNTER — Ambulatory Visit: Admit: 2018-06-07 | Discharge: 2018-06-08 | Payer: MEDICAID

## 2018-06-07 DIAGNOSIS — K319 Disease of stomach and duodenum, unspecified: Principal | ICD-10-CM

## 2018-06-07 DIAGNOSIS — R339 Retention of urine, unspecified: Secondary | ICD-10-CM

## 2018-06-07 DIAGNOSIS — M549 Dorsalgia, unspecified: ICD-10-CM

## 2018-06-07 DIAGNOSIS — J45909 Unspecified asthma, uncomplicated: ICD-10-CM

## 2018-06-07 DIAGNOSIS — J449 Chronic obstructive pulmonary disease, unspecified: ICD-10-CM

## 2018-06-07 DIAGNOSIS — R102 Pelvic and perineal pain: Secondary | ICD-10-CM

## 2018-06-07 DIAGNOSIS — K289 Gastrojejunal ulcer, unspecified as acute or chronic, without hemorrhage or perforation: ICD-10-CM

## 2018-06-07 LAB — URINALYSIS, MICROSCOPIC

## 2018-06-07 NOTE — Assessment & Plan Note
Will monitor  Recommend annual renal imaging, if not completed for other evaluations  Would recommend at least Renal US 12/2018  However with GI symptoms will likely have abdominal imaging, will review before ordering  Increase fluids, low salt, low animal protein  Hold on 24 hour urine evaluation at this time, 1st and only stone

## 2018-06-07 NOTE — Progress Notes
1st PVR =  2nd PVR = 56mL

## 2018-06-08 DIAGNOSIS — M6289 Other specified disorders of muscle: ICD-10-CM

## 2018-06-08 DIAGNOSIS — R350 Frequency of micturition: Principal | ICD-10-CM

## 2018-06-08 DIAGNOSIS — K5900 Constipation, unspecified: ICD-10-CM

## 2018-06-08 DIAGNOSIS — N200 Calculus of kidney: Secondary | ICD-10-CM

## 2018-06-08 LAB — CULTURE-URINE W/SENSITIVITY

## 2018-06-09 ENCOUNTER — Encounter: Admit: 2018-06-09 | Discharge: 2018-06-09 | Payer: PRIVATE HEALTH INSURANCE

## 2018-06-09 NOTE — Telephone Encounter
Pt called in regards to the previous vm that was left for her from [place]. Pt stated that if we need another UA then we can set that up with [facility] hospital for that and whatever else we need.   Pt seemed agitated.    Called pt back, was unable to reach pt or vm.    Forwarding to brice brown and Marisue Ivan.

## 2018-06-09 NOTE — Telephone Encounter
LVM explaining her urine culture showed lactobacillus and it is recommended that she be set up for a nurse visit for cath sample. Will try to call back later today to schedule.

## 2018-06-10 ENCOUNTER — Encounter: Admit: 2018-06-10 | Discharge: 2018-06-10 | Payer: PRIVATE HEALTH INSURANCE

## 2018-06-10 DIAGNOSIS — R300 Dysuria: Principal | ICD-10-CM

## 2018-06-10 NOTE — Telephone Encounter
Spoke to patient about recent contaminated urine sample. Explained the process of nurse visit to obtain cath sample but she wants to try one more time giving a voided sample, making sure to clean very well and collect sample mid-stream. Asked that I faxed order for UC and micro to [facility] Fax# [phone]. Done at this time.

## 2018-06-11 LAB — URINALYSIS, MICROSCOPIC

## 2018-06-22 ENCOUNTER — Encounter: Admit: 2018-06-22 | Discharge: 2018-06-22 | Payer: PRIVATE HEALTH INSURANCE

## 2018-06-22 DIAGNOSIS — R300 Dysuria: Principal | ICD-10-CM

## 2018-06-22 NOTE — Progress Notes
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

## 2018-06-23 ENCOUNTER — Encounter: Admit: 2018-06-23 | Discharge: 2018-06-23 | Payer: PRIVATE HEALTH INSURANCE

## 2018-08-10 ENCOUNTER — Encounter: Admit: 2018-08-10 | Discharge: 2018-08-11 | Payer: PRIVATE HEALTH INSURANCE

## 2018-08-18 ENCOUNTER — Encounter: Admit: 2018-08-18 | Discharge: 2018-08-18 | Payer: PRIVATE HEALTH INSURANCE

## 2020-07-02 ENCOUNTER — Encounter: Admit: 2020-07-02 | Discharge: 2020-07-02 | Payer: PRIVATE HEALTH INSURANCE

## 2020-07-02 NOTE — Telephone Encounter
Error. CMoore AA.

## 2020-08-21 ENCOUNTER — Encounter: Admit: 2020-08-21 | Discharge: 2020-08-21 | Payer: PRIVATE HEALTH INSURANCE

## 2020-08-23 ENCOUNTER — Encounter: Admit: 2020-08-23 | Discharge: 2020-08-23 | Payer: PRIVATE HEALTH INSURANCE

## 2020-08-23 DIAGNOSIS — D128 Benign neoplasm of rectum: Secondary | ICD-10-CM

## 2020-08-23 DIAGNOSIS — K648 Other hemorrhoids: Secondary | ICD-10-CM

## 2020-08-23 DIAGNOSIS — M6289 Other specified disorders of muscle: Secondary | ICD-10-CM

## 2020-08-23 DIAGNOSIS — K5909 Other constipation: Secondary | ICD-10-CM

## 2020-08-23 DIAGNOSIS — N816 Rectocele: Secondary | ICD-10-CM

## 2020-08-23 DIAGNOSIS — R140 Abdominal distension (gaseous): Secondary | ICD-10-CM

## 2020-08-23 DIAGNOSIS — K561 Intussusception: Secondary | ICD-10-CM

## 2020-08-23 NOTE — Progress Notes
Pt called OOC twice to reschedule. Has refused appointment scheduling outside of Fridays.   Rn called patient to discuss further. She explained she will be starting a new job and if appointments needs extend into the summer, she will require appointment offerings on Fridays only.   Rn advised that Dr. Hassell Done is unable to fulfill this request as his clinics are Tuesdays/Wednesdays only. Pt feels she is unable to remain with this clinic, due to her personal scheduling needs.     Rn advised we can assist with a referral to new provider. Pt explains she would like to keep care within [facility] and not seek outside provider. RN will place referral as there is another provider within this service who has Friday appointment availabilities. She v/u and would like to transfer care if possible. Dr. Hassell Done okay with patient request and will assist with referral needs. Pt was explained that new patient scheduling is out until the mid summer dates here at [facility]. She v/u and is okay to continue to proceed with establishment of care with new provider.

## 2020-08-23 NOTE — Telephone Encounter
Katelyn Wu was called by this office to discuss clinic scheduling. Call back # provided and requested patient touch base to discuss needs further.

## 2020-09-04 ENCOUNTER — Encounter: Admit: 2020-09-04 | Discharge: 2020-09-04 | Payer: PRIVATE HEALTH INSURANCE

## 2020-09-04 NOTE — Telephone Encounter
New Patient Appointment with Colorectal Surgery:   Date: Fri (10/26/20)  Time: 10AM  Provider Name: Dr. Cornell Barman  Other Appt/Tests Coordinated: n/a    Language Interpreter: n/a    Need Hoyer Lift: n/a    Patient expressed understanding of below:   -Check-in 30 min early:  yes  -Wait List: yes  -No-Show/Late Cancellation Policy: yes  -Insurance Referral Authorization/Self-Pay: n/a    Provider Preference: Dr. Cornell Barman. Pt requested a Friday appointment.     Clinic Location: [facility], [address] [place], [address], [place], [place]  [address]    Verbal directions to appointment given on: 5.10.22       Patient has MyChart Access:  yes      Referring Provider: Dr. Eulah Pont (CRS)    Care Team Updated on: 5.10.22    Diagnosis: hemorrhoids, h/o obstructive defecation.          Verbal records history from: patient    Continuation of Care    GI Lab Testing/Procedure Reports with any Associated Pathology (FROM LAST 10 YEARS):   Colonoscopy, EGD, Flexible Sigmoidoscopy, Anorectal Manometry, Capsule Endoscopy, Endoscopic Ultrasound   Patient GI Testing History:     3.28.19 flex sig at [facility]    2019 EGD at [facility]  ?  2.21.19 ARM at [facility]  ?  approx 1-2 yrs ago Colonoscopy at [facility], [place], [place] by Dr. Jonah Blue.   ?  approx 18+yrs ago EGD and Colonoscopy at [facility], [place], [place]    approx 3mos ago - colonoscopy at [facility], [place], [place]     Operative Report, Surgical Pathology & Discharge Summary (FROM LAST 10 YEARS):   Abdominal & Pelvic Surgeries, Colon, Rectal & Anal Surgeries (Please also note lifetime history.)  Patient Abdominal/Pelvic Surgery History:     approx 2017 RBL of Hemorrhoid by Dr. Jonah Blue in his office.    ?  9/10 yrs ago Partial Hysterectomy at [facility], [place], [place]     approx 3-5 yrs ago - Hiatal hernia repair at [facility], [place], [place]     2019 - surgery for rectocele and obstructive defecation by Dr. Eulah Pont ([facility]) at [facility]    2022 - umbilical hernia repair and upper gastric hernia repair at [facility], [place], [place]    approx 4-5 mos ago - hemorrhoid procedure at [facility], [place], [place]     Radiology Imaging Reports & Cloud Images (FROM LAST 5 YEARS):   CT c/a/p, MRI a/p, MRE, Xray: Abdomen (KUB), Pelvis, Acute Abdominal Series, Small Bowel Follow Thru, PET Scan: Full Body or Skull to Thighs. Colon Single Contrast &/or MRI Defecography, Barium/Gastrographin Enemas, Sitz Marker Study  Patient Radiology History:     [facility], [place], [place]  ?  [place].     Pelvic Floor Physical Therapy Progress Notes or Biofeedback Testing/Pelvic EMG Testing (FROM LAST 5 YEARS).   Patient Physical Therapy History: n/a     Gastroenterology Office Visit Notes/Progress Notes (FROM LAST 5 YEARS).  Patient Office Visit Notes/Progress Notes History: CNC/RN to review and request as needed.     Emailed Records/Referral from Physician Consult to [email or web address] (scan into O2) and corresponding CNC/RN: n/a      ?  ?     ?  ?

## 2020-10-18 NOTE — Progress Notes
This prechart is intended to be a reference for patient appointments. Information is gathered from in chart as well as external records. Information will be clarified/verified/updated in final documentation in the office visit.     .Pre Clinic Pre Chart:  ?  Problem List:  Constipation   Hemorrhoids  Pelvic Floor dysfunction    Some records may not have been available at time of prechart. Please check for additional records.    04/16/2020 - Colonoscopy:  Colonoscope was inserted to the cecum.  1 polyp in the rectum biopsied away with cold forceps and random biopsies taken throughout the colon and rectum.    03/13/2020 - Small Bowel Series:  1. Dilatation of small and large bowel loops without significant delay in transit of contrast through the small bowel.     03/12/2020 - 03/14/2020 - Admission:  Indication for Admission: ADAYSHA TREVOR is a 50 year-old AA female who has had idiopathic bowel troubles for 40 years. Patient has received all of her advanced and specialty GI care through [facility] Since at least 2018. We were asked to accept her in transfer. She has also has an extensive work up there as well. I have a recent note I can quote from below. Patient reported that she was taken for her surgery to repair those hernia I listed below. I do not have the operative reports. Patient had two separate stays for reported post-operative ileus. Patient went back to [facility] and now she is transferred here. At the time that I saw her she had an NG tube to clamp, she reported mild generalized abdominal pain, she reported no chest pain or shortness of air. Patient has had no vomiting, no blood in her stools or urine and no blood emesis. Patient reported her pain was central in her abdomen and cramping. Patient is passing little flatus. No recent BM reported. (Per H&P)    Hospital Course: During this hospitalization the following diagnosis were discussed with patient / family:    ? Ileus following gastrointestinal surgery/ (HCC) S/P repair of ventral hernia/ S/P epigastric hernia repair: Patient underwent ventral and umbilical hernia repairs at the beginning of November. She presented to the ED in [place] with c/o abdominal pain and distention. Xray revealed and ileus. An NG tube was placed. She was transferred her for further evaluation and treatment. She was placed on IV fluids, prn antiemetics, prn Toradol and scheduled Ofirmev. Avoided narcotics. General Surgery and Gastroenterology were consulted for assistance. The next day, SB follow-through showed diffuse intestinal dilation but contrast moved through without significant delay. NG tube was discontinued and she was started on clear liquid diet and advanced as tolerated. She continued to pass gas and did have a BM. Her abdominal pain and nausea were controlled. GI recommended outpatient care. Patient is at her baseline level of health. Patient was never obstructed, she probably had post-operative ileus. Patient should see Dr. Baron Hamper for any further concerns about hernia repairs.    ? 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  ? Chronic idiopathic constipation: She had been on Reglan in the past but stopped the medication as it had stopped working. Motegrity was recommended but prior authorization was not performed or approved so she was never able to start the medication.  ? Gas bloat syndrome: Added simethicone.    06/14/2019 - GI:  Plan  1. Prescription for Zelnorm 6 mg twice daily.   2. Take a dose of Miralax nightly.  3. Follow-up in 4-6  weeks. Pending symptoms, consider treatment of SIBO.  4. This patient was discussed with Dr. Florian Buff who was an active participant in developing the assessment and plan as outlined above.    08/10/2018 - CT Abd:  Redemonstration of small bilateral nonobstructive renal calculi, measuring up to 5 mm.    02/08/2018 - 1) Robotic ventral mesh rectopexy 2) Flexible sigmoidscopy  Findings:??Redundant sigmoid colon, well positioned mesh    12/24/2017 - MRI Defecography:  1. ?Pelvic floor relaxation with marked widening and moderate descent   during defecation.   2. ?Moderate anterior rectocele.  3. ?Mild cystocele with urethral hypermobility. There is no evidence of   vaginal prolapse.    09/09/2017 - Rubber band ligation performed of the right posterolateral and anterolateral?internal hemorrhoids.    07/31/2017 - GI:   Pt leaves message reporting that she is requesting that images from procedure be sent to Dr. Larina Bras so that he can do something regarding the hemorrhoids.   pt also states, And actually the Trulance is working so, I don't really need the colorectal people to call me at all.  Per pt she does not want to schedule the pelvic floor physical therapy until I get something done with these hemorrhoids. So, that's where I'm at.     07/30/2017 - GI:  Pt returns call stating that she would like to be referred to colorectal surgery now instead of waiting to see if PT is helpful.   Pt also reports that she read on the report that there were internal and external hemorrhoids noted and is wondering what this is about.?  Colorectal surgery consult placed, encourage her to proceed ?with PT. ?Anticipate they will want to see how she responds to PT prior to any surgical intervention.    07/23/2017 - Flex Sig:  The Flexible   ? ? ?sigmoidoscope was introduced through the anus and advanced to the   ? ? ?sigmoid colon. The flexible sigmoidoscopy was accomplished without   ? ? ?difficulty. The patient tolerated the procedure well. The quality of the   ? ? ?bowel preparation was good.   Findings: ? ? ? ? ??? ? ? ? ? A 8 mm polyp was found in the rectum. The polyp   ? ? ? ? ? ? ? ? ? ? ? ? ? ? ? was semi-sessile. The polyp was removed with a   ? ? ? ? ? ? ? ? ? ? ? ? ? ? ? cold snare. Resection and retrieval were   ? ? ? ? ? ? ? ? ? ? ? ? ? ? ? complete. For hemostasis, one hemostatic clip   ? ? ? ? ? ? ? ? ? ? ? ? ? ? ? was successfully placed (MR conditional). There   ? ? ? ? ? ? ? ? ? ? ? ? ? ? ? was no bleeding at the end of the procedure.   ? ? ? ? ? ? ? ? ? ? ? ? ? ? ? Non-bleeding external and internal hemorrhoids   ? ? ? ? ? ? ? ? ? ? ? ? ? ? ? were found during retroflexion. The hemorrhoids   ? ? ? ? ? ? ? ? ? ? ? ? ? ? ? were small. scar of prior hemorrhoidectomy? was   ? ? ? ? ? ? ? ? ? ? ? ? ? ? ? noted in rectum (perianal area).   ? ? ? ? ? ? ? ? ? ? ? ? ? ? ?  1 cm above dentate line (saway from   ? ? ? ? ? ? ? ? ? ? ? ? ? ? ? hemorrhoidectomy scar), suction biopsy (cap   ? ? ? ? ? ? ? ? ? ? ? ? ? ? ? assissted EMR) was performed. Gap was closed   ? ? ? ? ? ? ? ? ? ? ? ? ? ? ??using 2 endoclips   ? ? ? ? ? ? ? ? ? ? ? ? ? ? ? scope was advanced to sigmoid colon, 35 cm an   ? ? ? ? ? ? ? ? ? ? ? ? ? ? ? dcolonic mucosa looked normal.   ? ? ? ? ? ? ? ? ? ? ? ? ? ? ? The rectum and sigmoid colon appeared normal.   ? ? ? ? ? ? ? ? ? ? ? ? ? ? ? Preparations were made for mucosal resection.   ? ? ? ? ? ? ? ? ? ? ? ? ? ? ? Band ligator and snare mucosal resection was   ? ? ? ? ? ? ? ? ? ? ? ? ? ? ? performed. Resection and retrieval were   ? ? ? ? ? ? ? ? ? ? ? ? ? ? ? complete. To close a defect after mucosal   ? ? ? ? ? ? ? ? ? ? ? ? ? ? ? resection, two hemostatic clips were   ? ? ? ? ? ? ? ? ? ? ? ? ? ? ? successfully placed (MR conditional). There was   ? ? ? ? ? ? ? ? ? ? ? ? ? ? ? no bleeding at the end of the procedure.   Impression: ? ? ? ? ? ? ? ? ? - One 8 mm polyp in the rectum, removed with a   ? ? ? ? ? ? ? ? ? ? ? ? ? ? ? cold snare. Resected and retrieved. Clip (MR   ? ? ? ? ? ? ? ? ? ? ? ? ? ? ? conditional) was placed.   ? ? ? ? ? ? ? ? ? ? ? ? ? ? ??- Non-bleeding external and internal   ? ? ? ? ? ? ? ? ? ? ? ? ? ? ? hemorrhoids.   Pathology:  A. Colonic mucosa, rectal polyp, biopsy: ?   Tubular adenoma. ?     B. Colonic mucosa, suction rectal biopsy r/o hirschprungs: ?   Ganglion cells present.   No diagnostic abnormalities. ?     06/18/2017 - ARM:  Perianal sensation and weak are present.  Mean anal sphincter resting pressure is 77.6 mm per mercury.  Mean anal sphincter squeeze is 57 mm per mercury.  Maximal anal sphincter squeeze pressure is 150.9 mm per mercury.  Residual anal sphincter pressure of 74 mm per mercury in left lateral position.  Percentage of anal relaxation is 6%.  Intrarectal pressure on attempt to defecate is 42.4 mm per mercury in the left lateral position and consistent with an adequate propulsive force.  IA RR is absent.  Rectal sensation threshold is at 70 cc desire to defecate is sensed 120 cc urge to defecate is sensed at 170 cc MTV is 220 cc.  Patient is not to pass balloon 4 minutes.  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  Recommend:  1.  Consider suction biopsy, to rule out Hirschsprung's disease.  2.  Biofeedback therapy.  3.  Follow-up with referring physician    05/13/2017 - Defo:  1. Mildly exaggerated caudal migration of the anorectal junction during   defecation, suggestive of mild pelvic floor laxity.  2. Small anterior rectocele.    04/20/2017 - GI:  HPI:  She reports that essentially her symptoms are unchanged but is?having a more difficult time with constipation and bloating and looks like she is pregnant.??She can go up to 1-2 weeks without a bowel movement and then will take 5-6 Dulcolax a day that will help stimulate her bowels but does not completely empty her bowels. ?She also uses MOM as needed.??She says she previously tried over-the-counter fiber products, MiraLAX, milk of magnesia, stool softeners and stimulants, she?has?also tried Linzess and Amitiza?without benefit and made her feel worse. ?She typically can strain up to 30 minutes prior to a bowel movement. ??The best that she has felt was after a colonoscopy last year when her abdominal discomfort,?bloating and nausea significantly improved for a short period of time. ?Generally the symptoms do improve after she has had a good bowel movement and is is?finding this to be more challenging. ?She tells me that she was told over a decade ago that she would likely need surgery and end up with a bag.  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 ?I would like to complete an anal rectal manometry as well as defecography and if positive send her for physical therapy. ?In the meantime,?advised her to obtain a squatty potty that may help her in positioning to have a bowel movement better and given her some pelvic floor exercises to work on at home. ?I feel that we need to reset her bowel and would recommend a bowel preparation with GoLYTELY and then initiate docusate 4 tabs a day to help prevent constipation. ?I feel if we can improve her constipation her abdominal discomfort,?bloating and nausea will also improve. ?It is possible,?in addition to pelvic floor dyssynergy,?as she may have a delayed colonic transit. ?However we need to rule out an obstructive component first. ?We are also limited as Sitz markers have been on back order for some time and even if positive, does not significantly change our current management though would make a difference if we were considering a surgical intervention.      01/14/2017 - Gastric Emptying:  Normal gastric emptying times.    02/04/2016 - Colonoscopy:  Digital anorectal examination was performed.  Digital anorectal examination was notable for some mild hemorrhoids, mild prolapse.  No active bleeding.  Normal anal tone.  The scope was advanced to the cecum.  Landmarks visualized including splenic flexure, hepatic flexure, ileocecal valve and cecum.  Anoscopy was negative for any mass, mass-effect, mucosal abnormalities, AVMs visualized.  The scope was then retroflexed in the rectum.  Retroflexion of the scope was negative.  There is good visualization of the mucosa throughout.  The patient tolerated the procedure well.    01/05/2016 - CT Abd/Pelv:  Nonobstructive left renal stone.  Normal appendix.  Constipation.  Narrowing of the rectosigmoid colon which is possibly due to incomplete distention.  Colonoscopy or exam following oral contrast could further evaluate.  ?    ?    ?    ?    ?    ?    ?     ?    ?    ?    ?

## 2020-10-19 ENCOUNTER — Encounter: Admit: 2020-10-19 | Discharge: 2020-10-19 | Payer: PRIVATE HEALTH INSURANCE

## 2020-10-24 ENCOUNTER — Encounter: Admit: 2020-10-24 | Discharge: 2020-10-24 | Payer: PRIVATE HEALTH INSURANCE

## 2020-10-26 ENCOUNTER — Encounter: Admit: 2020-10-26 | Discharge: 2020-10-26 | Payer: PRIVATE HEALTH INSURANCE

## 2020-10-26 ENCOUNTER — Encounter: Admit: 2020-10-26 | Discharge: 2020-10-26 | Payer: MEDICAID

## 2020-10-26 ENCOUNTER — Ambulatory Visit: Admit: 2020-10-26 | Discharge: 2020-10-26 | Payer: PRIVATE HEALTH INSURANCE

## 2020-10-26 DIAGNOSIS — K649 Unspecified hemorrhoids: Secondary | ICD-10-CM

## 2020-10-26 DIAGNOSIS — J45909 Unspecified asthma, uncomplicated: Secondary | ICD-10-CM

## 2020-10-26 DIAGNOSIS — K561 Intussusception: Secondary | ICD-10-CM

## 2020-10-26 DIAGNOSIS — M549 Dorsalgia, unspecified: Secondary | ICD-10-CM

## 2020-10-26 DIAGNOSIS — K319 Disease of stomach and duodenum, unspecified: Secondary | ICD-10-CM

## 2020-10-26 DIAGNOSIS — J449 Chronic obstructive pulmonary disease, unspecified: Secondary | ICD-10-CM

## 2020-10-26 DIAGNOSIS — K289 Gastrojejunal ulcer, unspecified as acute or chronic, without hemorrhage or perforation: Secondary | ICD-10-CM

## 2020-10-26 DIAGNOSIS — M6289 Other specified disorders of muscle: Secondary | ICD-10-CM

## 2020-10-26 MED ORDER — SODIUM CHLORIDE 0.9 % IV SOLP
250 mL | INTRAVENOUS | 0 refills
Start: 2020-10-26 — End: ?

## 2020-10-26 NOTE — Patient Instructions
[facility]  Surgery Instructions    Surgery Date: 12/12/20  (Please be aware that this appointment for your surgery will not be visible in the MyChart portal.)    Location:  ? Your surgery will be held at [facility] ([address], [place], [place] [address].)  ? Where to park on the day of surgery: [employer] [address] [address] (located just [place] [address] on [address])  ? Where to check in on the day of surgery: In the Admitting office (located on Level 1 of hospital at the main entrance.       Arrival Time:   The OR scheduling office must finalize the OR schedule before we can provide a time for your surgery. Please do not call your physicians office about surgery arrival time. Your surgery start time and arrive times will be called out to you the day before your procedure between 2pm-4:30pm. If you have not heard from the hospital regarding your surgery time by 4:30PM the evening before your surgery, please call [phone].  The day of the procedure, if you cannot keep your appointment, or are delayed, please contact the surgery center immediately at [phone].      COVID-19 Testing:   No testing needed for planned surgery. If you develop any COVID-19 symptoms within 14 days of your procedure, please call [phone]. Ask to be connected to [employer] (Nurse for Dr. Kathee Delton). .     Anyone can have mild to severe symptoms. People with these symptoms may have COVID-19:    ? Fever or chills  ? Cough  ? Shortness of breath or difficulty breathing  ? Fatigue  ? Muscle or body aches  ? Headache  ? New loss of taste or smell  ? Sore throat  ? Congestion or runny nose  ? Nausea or vomiting  ? Diarrhea      Eating and drinking: You must be on a clear liquid diet the entire day prior to surgery. Sips or small amounts of water and clear liquids may be consumed after 11pm, but must be stopped 2 hours before you need to arrive to the hospital the day of your surgery.     Bowel Prep: MIRALAX BOWEL PREP for Procedure    This will be completed the day prior to your surgery/procedure.    Shopping List: (Purchase these over the counter supplies at least a day or two before the prep)    1)  64 ounces of a clear liquid (Gatorade Ice, Gingerale, Sprite, etc. Avoid Red, Orange & Purple)  2)  238g sized bottle of Miralax Powder   3)  1 bottle of Magnesium Citrate (approximately10oz)- (do not take if you have kidney failure)  4)  4 tablets of Dulcolax    **The entire day before the procedure, beginning with your first meal of the day, drink a clear liquid diet only:          DO NOT EAT ANY SOLID or CREAMED FOODS.  o Clear liquids include:  - Coffee (no creamers or milk), Tea, Water  - flavored drinks, Gatorade, juices (No RED ORANGE or PURPLE in color)  - broth, bouillon, jello (No RED, ORANGE or PURPLE in color)    ? At 12 noon, drink the entire bottle of Magnesium Citrate.  ? At 3 PM, take 4 tablets of Dulcolax by mouth.  ? At 5 PM mix the entire bottle (238g) of Miralax with all  64oz of clear liquid of choice.   ? Try to finish drinking the entire 64 ounces of Miralax mixture in about 2 hours- this equates to drinking an 8oz glass every ten minutes.  If you need more time, take it. We would prefer you to finish the entire prep than to stop part way through.      Continue to drink lots of clear liquids to stay well hydrated and protect your kidneys.  Your urine should be light yellow.  If your urine becomes dark yellow you are not drinking enough. STOP drinking EVERYTHING 2 hours prior to your arrival to the hospital the day of surgery/procedure.      Hygiene:  Shower the night before and the morning of your procedure , you will not need to perform your showers with any special soaps, just be sure you clean on the day of your surgery.    Personal Items:    ? Do not wear makeup, fingernail polish, lotion, deodorant, or perfume  ? Remove all metal jewelry and piercings.   ? Bring a storage container for your glasses, contacts, hearing aids, dentures, etc.     Thing to Bring:    ? Personal identification (ID)   ? Insurance card    Medications:    ? You will be given medication instructions by the Pre-Operative Assessment [facility]). If you have NOT been scheduled a Pre-Operative Assessment appointment, you will receive a phone call to be ?triaged? by the anesthesia team. Please be sure to speak with the anesthesia team or attend your [facility] appointment if setup for you. If you do not, this may result in cancellation or delay of your surgery.  ? If you did not receive medication instructions, or have questions about any of your current medications, as it relates to the day of your surgery, please call [phone].    Transportation/Visitor Policy:      During the COVID-19 crisis, due to the size of the waiting room, only 1 visitor is allowed in the designated perioperative waiting room. No visitors are allowed in pre/post patient care areas until a patient is transferred to a patient room.  If you would like more than 1 visitor to be at the hospital during surgery, please be aware you are only allowed a maximum of 2 total visitors and please know your visitors will be asked to find a place to wait in the hospital lobby or cafeteria and will be notified by the nurse liaison team, when you are out of surgery.    You are having an outpatient surgery, a responsible adult you know and trust is required to drive you home and stay with you for 12-24 hours after surgery. If you do not have this arranged prior to surgery, your surgery may be delayed or cancelled. Remember, you are not allowed to drive for 24 hours after surgery.    Exceptions include:     ? No visitors allowed for patients with active COVID-19 infections.  ? No visitors under the age of 64 are allowed for patients admitted to the hospital.  ? Adult inpatients in semiprivate rooms may only have 2 visitor at a time, but may only have up to 2 different visitors in a single day. Due to space limitation, if you are in a shared room with another patient, this is to be sure that the other room occupant is allowed to have a visitor as well.   All visitors will be screened upon entry and  must:     ? Wear a mask at all times until further notice.  ? Maintain physical distancing of at least six feet from others.  ? Wash hands or use hand sanitizer when entering or exiting patient rooms.     Any visitors who have a fever or other cold- or flu-like symptoms will not be allowed in [facility] [place] [facility] facilities. We will continue to evaluate our recommendations and keep you updated on revisions based on public health guidance.       FMLA:   If you have any FMLA or Short Term Disability Paperwork needs, you may have these faxed to [phone]- Attention to Marni Griffon, RN. Be sure your name and birthday are on the forms, along with a return fax number for this office to return them to once completed. Please allow up to 7-10 business days for completion of paperwork.     Billing Questions:   A member of the business office will verify your medical benefits with your insurance company prior to your surgery. If you have questions about understanding the cost of your care please call the Financial Customer Service at [phone].         If you have questions, please call [phone]. Ask to be connected to [employer] (Nurse for Dr. Kathee Delton).     If your needs are URGENT and it?s after hours   please call [phone] and have provider on call paged.

## 2020-10-26 NOTE — Progress Notes
Name: Katelyn Wu          MRN: [id number]      DOB: 01-Dec-1970      AGE: 50 y.o.   DATE OF SERVICE: 10/26/2020    Subjective:             Reason for Visit:  No chief complaint on file.      Katelyn Wu is a 50 y.o. female.     Cancer Staging  No matching staging information was found for the patient.    History of Present Illness    50 y.o. female with one year history of pain and abdominal pain.  She had a ct scan and noted two hernias.  She got two hernia repairs.  Post operative ileus.  Admitted twice for this.  NGT and eventually resolved.  [name] went to [facility] and had small bowel follow through.      [name] notes that when she has to have a BM something pops out and she has issues with coming out.    She has used enemas.      Her BM were not normal prior to the surgery, the thought was that the hernias caused her BM issues.      [name] notes that something comes out when she has a BM.  This reduces on its own.      Colonoscopy was 2021 without lesions or masses.      BM she will sit there for some time.  When she goes a day or so without a BM, she will do an enema and then get some out.      She did pelvic floor PT for 6 months here at [facility].  Discussion about sphincter not working.      She can note that at times she will have accidents with her BM.      No description of splinting.  Problem List:   Constipation   Hemorrhoids   Pelvic Floor dysfunction   Some records may not have been available at time of prechart. Please check for additional records.   04/16/2020 - Colonoscopy:   Colonoscope was inserted to the cecum. 1 polyp in the rectum biopsied away with cold forceps and random biopsies taken throughout the colon and rectum.   03/13/2020 - Small Bowel Series:   1. Dilatation of small and large bowel loops without significant delay in transit of contrast through the small bowel.   03/12/2020 - 03/14/2020 - Admission:  Indication for Admission: Katelyn Wu is a 50 year-old AA female who has had idiopathic bowel troubles for 40 years. Patient has received all of her advanced and specialty GI care through [facility] Since at least 2018. We were asked to accept her in transfer. She has also has an extensive work up there as well. I have a recent note I can quote from below. Patient reported that she was taken for her surgery to repair those hernia I listed below. I do not have the operative reports. Patient had two separate stays for reported post-operative ileus. Patient went back to [facility] and now she is transferred here. At the time that I saw her she had an NG tube to clamp, she reported mild generalized abdominal pain, she reported no chest pain or shortness of air. Patient has had no vomiting, no blood in her stools or urine and no blood emesis. Patient reported her pain was central in her abdomen and cramping. Patient is passing little  flatus. No recent BM reported. (Per H&P)    Hospital Course: During this hospitalization the following diagnosis were discussed with patient / family:    ? Ileus following gastrointestinal surgery/ (HCC) S/P repair of ventral hernia/ S/P epigastric hernia repair: Patient underwent ventral and umbilical hernia repairs at the beginning of November. She presented to the ED in [place] with c/o abdominal pain and distention. Xray revealed and ileus. An NG tube was placed. She was transferred her for further evaluation and treatment. She was placed on IV fluids, prn antiemetics, prn Toradol and scheduled Ofirmev. Avoided narcotics. General Surgery and Gastroenterology were consulted for assistance. The next day, SB follow-through showed diffuse intestinal dilation but contrast moved through without significant delay. NG tube was discontinued and she was started on clear liquid diet and advanced as tolerated. She continued to pass gas and did have a BM. Her abdominal pain and nausea were controlled. GI recommended outpatient care. Patient is at her baseline level of health. Patient was never obstructed, she probably had post-operative ileus. Patient should see Dr. Baron Hamper for any further concerns about hernia repairs.    ? 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  ? Chronic idiopathic constipation: She had been on Reglan in the past but stopped the medication as it had stopped working. Motegrity was recommended but prior authorization was not performed or approved so she was never able to start the medication.  ? Gas bloat syndrome: Added simethicone.   06/14/2019 - GI:  Plan  1. Prescription for Zelnorm 6 mg twice daily.   2. Take a dose of Miralax nightly.  3. Follow-up in 4-6 weeks. Pending symptoms, consider treatment of SIBO.  4. This patient was discussed with Dr. Florian Buff who was an active participant in developing the assessment and plan as outlined above.   08/10/2018 - CT Abd:   Redemonstration of small bilateral nonobstructive renal calculi, measuring up to 5 mm.   02/08/2018 - 1) Robotic ventral mesh rectopexy 2) Flexible sigmoidscopy   Findings: Redundant sigmoid colon, well positioned mesh   12/24/2017 - MRI Defecography:   1. Pelvic floor relaxation with marked widening and moderate descent   during defecation.   2. Moderate anterior rectocele.  3. Mild cystocele with urethral hypermobility. There is no evidence of   vaginal prolapse.   09/09/2017 - Rubber band ligation performed of the right posterolateral and anterolateral internal hemorrhoids.   07/31/2017 - GI:   Pt leaves message reporting that she is requesting that images from procedure be sent to Dr. Larina Bras so that he can do something regarding the hemorrhoids.   pt also states, And actually the Trulance is working so, I don't really need the colorectal people to call me at all.   Per pt she does not want to schedule the pelvic floor physical therapy until I get something done with these hemorrhoids. So, that's where I'm at.   07/30/2017 - GI:   Pt returns call stating that she would like to be referred to colorectal surgery now instead of waiting to see if PT is helpful.   Pt also reports that she read on the report that there were internal and external hemorrhoids noted and is wondering what this is about.   Colorectal surgery consult placed, encourage her to proceed with PT. Anticipate they will want to see how she responds to PT prior to any surgical intervention.   07/23/2017 - Flex Sig:   The Flexible   sigmoidoscope was introduced through the anus and  advanced to the   sigmoid colon. The flexible sigmoidoscopy was accomplished without   difficulty. The patient tolerated the procedure well. The quality of the   bowel preparation was good.   Findings: A 8 mm polyp was found in the rectum. The polyp   was semi-sessile. The polyp was removed with a   cold snare. Resection and retrieval were   complete. For hemostasis, one hemostatic clip   was successfully placed (MR conditional). There   was no bleeding at the end of the procedure.   Non-bleeding external and internal hemorrhoids   were found during retroflexion. The hemorrhoids   were small. scar of prior hemorrhoidectomy? was   noted in rectum (perianal area).   1 cm above dentate line (saway from   hemorrhoidectomy scar), suction biopsy (cap   assissted EMR) was performed. Gap was closed   using 2 endoclips   scope was advanced to sigmoid colon, 35 cm an   dcolonic mucosa looked normal.   The rectum and sigmoid colon appeared normal.   Preparations were made for mucosal resection.   Band ligator and snare mucosal resection was   performed. Resection and retrieval were   complete. To close a defect after mucosal   resection, two hemostatic clips were   successfully placed (MR conditional). There was   no bleeding at the end of the procedure.   Impression: - One 8 mm polyp in the rectum, removed with a   cold snare. Resected and retrieved. Clip (MR   conditional) was placed.   - Non-bleeding external and internal hemorrhoids.   Pathology:   A. Colonic mucosa, rectal polyp, biopsy:   Tubular adenoma.     B. Colonic mucosa, suction rectal biopsy r/o hirschprungs:   Ganglion cells present.   No diagnostic abnormalities.   06/18/2017 - ARM:   Perianal sensation and weak are present.   Mean anal sphincter resting pressure is 77.6 mm per mercury.   Mean anal sphincter squeeze is 57 mm per mercury.   Maximal anal sphincter squeeze pressure is 150.9 mm per mercury.   Residual anal sphincter pressure of 74 mm per mercury in left lateral position.   Percentage of anal relaxation is 6%.   Intrarectal pressure on attempt to defecate is 42.4 mm per mercury in the left lateral position and consistent with an adequate propulsive force.   IA RR is absent.   Rectal sensation threshold is at 70 cc desire to defecate is sensed 120 cc urge to defecate is sensed at 170 cc MTV is 220 cc.   Patient is not to pass balloon 4 minutes.   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   Recommend:   1. Consider suction biopsy, to rule out Hirschsprung's disease.   2. Biofeedback therapy.   3. Follow-up with referring physician   05/13/2017 - Defo:   1. Mildly exaggerated caudal migration of the anorectal junction during   defecation, suggestive of mild pelvic floor laxity.  2. Small anterior rectocele.   04/20/2017 - GI:   HPI:   She reports that essentially her symptoms are unchanged but is having a more difficult time with constipation and bloating and looks like she is pregnant. She can go up to 1-2 weeks without a bowel movement and then will take 5-6 Dulcolax a day that will help stimulate her bowels but does  not completely empty her bowels. She also uses MOM as needed. She says she previously tried over-the-counter fiber products, MiraLAX, milk of magnesia, stool softeners and stimulants, she has also tried Linzess and Amitiza without benefit and made her feel worse. She typically can strain up to 30 minutes prior to a bowel movement. The best that she has felt was after a colonoscopy last year when her abdominal discomfort, bloating and nausea significantly improved for a short period of time. Generally the symptoms do improve after she has had a good bowel movement and is is finding this to be more challenging. She tells me that she was told over a decade ago that she would likely need surgery and end up with a bag.   Plan:   My suspicion is this patient has colonic dysmotility, possibly with component of pelvic dyssynergy. I would like to complete an anal rectal manometry as well as defecography and if positive send her for physical therapy. In the meantime, advised her to obtain a squatty potty that may help her in positioning to have a bowel movement better and given her some pelvic floor exercises to work on at home. I feel that we need to reset her bowel and would recommend a bowel preparation with GoLYTELY and then initiate docusate 4 tabs a day to help prevent constipation. I feel if we can improve her constipation her abdominal discomfort, bloating and nausea will also improve. It is possible, in addition to pelvic floor dyssynergy, as she may have a delayed colonic transit. However we need to rule out an obstructive component first. We are also limited as Sitz markers have been on back order for some time and even if positive, does not significantly change our current management though would make a difference if we were considering a surgical intervention.   01/14/2017 - Gastric Emptying:   Normal gastric emptying times.   02/04/2016 - Colonoscopy:   Digital anorectal examination was performed. Digital anorectal examination was notable for some mild hemorrhoids, mild prolapse. No active bleeding. Normal anal tone. The scope was advanced to the cecum. Landmarks visualized including splenic flexure, hepatic flexure, ileocecal valve and cecum. Anoscopy was negative for any mass, mass-effect, mucosal abnormalities, AVMs visualized. The scope was then retroflexed in the rectum. Retroflexion of the scope was negative. There is good visualization of the mucosa throughout. The patient tolerated the procedure well.   01/05/2016 - CT Abd/Pelv:   Nonobstructive left renal stone. Normal appendix. Constipation. Narrowing of the rectosigmoid colon which is possibly due to incomplete distention. Colonoscopy or exam following oral contrast could further evaluate      Medical History:   Diagnosis Date   ? Asthma    ? Back pain    ? COPD mixed type (HCC)     and asthma   ? Stomach problems    ? Ulcer of the stomach and intestine      Surgical History:   Procedure Laterality Date   ? ANORECTAL MANOMETRY N/A 06/18/2017    Performed by Jolee Ewing, MD at [facility] ENDO   ? flex sig N/A 07/23/2017    Performed by Jolee Ewing, MD at [facility] OR   ? ROBOT VENTRAL MESH RECTOPEXY N/A 02/08/2018    Performed by Benetta Spar, MD at [facility] OR   ? REPAIR RECTOCELE  02/08/2018    Performed by Benetta Spar, MD at [facility] OR   ? EGD N/A 04/16/2018    Performed by Remigio Eisenmenger, MD at [facility] ENDO   ?  ESOPHAGOGASTRODUODENOSCOPY WITH BIOPSY - FLEXIBLE  04/16/2018    Performed by Remigio Eisenmenger, MD at [facility] ENDO   ? HX HYSTERECTOMY     ? HYSTERECTOMY      2009   ? TUBAL LIGATION       No family history on file.  Social History     Socioeconomic History   ? Marital status: Single   Occupational History     Comment: paraprofessional   Tobacco Use   ? Smoking status: Never Smoker   ? Smokeless tobacco: Never Used   Substance and Sexual Activity   ? Alcohol use: No   ? Drug use: No   ? Sexual activity: Not Currently                        Review of Systems   Constitutional: Negative for activity change, chills, fatigue and fever.   HENT: Negative for congestion.    Eyes: Negative.    Respiratory: Negative for cough, choking, chest tightness and shortness of breath.    Cardiovascular: Negative for chest pain and leg swelling.   Gastrointestinal: Positive for abdominal distention, abdominal pain, constipation and rectal pain. Negative for anal bleeding, blood in stool, diarrhea, nausea and vomiting.   Endocrine: Negative for polydipsia and polyphagia.   Genitourinary: Negative for difficulty urinating, dysuria and frequency.   Musculoskeletal: Negative for arthralgias and myalgias.   Skin: Negative for pallor and rash.   Neurological: Negative for dizziness and light-headedness.   Hematological: Negative for adenopathy. Does not bruise/bleed easily.   Psychiatric/Behavioral: Negative for agitation, behavioral problems and confusion.         Objective:         ? COMBIVENT RESPIMAT 20-100 mcg/actuation mist inhaler    ? fluticasone/salmeterol (ADVAIR DISKUS) 500-50 mcg inhalation disk Inhale 1 puff by mouth into the lungs every 12 hours.   ? oxybutynin XL (DITROPAN XL) 5 mg tablet Take 5 mg by mouth daily.     There were no vitals filed for this visit.  There is no height or weight on file to calculate BMI.                  Pain Addressed:  N/A    Patient Evaluated for a Clinical Trial: No treatment clinical trial available for this patient.     [place] Cooperative Oncology Group performance status is 0, Fully active, able to carry on all pre-disease performance without restriction.Marland Kitchen     Physical Exam  Vitals reviewed.   Constitutional:       Appearance: She is well-developed.   HENT:      Head: Normocephalic and atraumatic.   Eyes:      General: No scleral icterus.        Right eye: No discharge.         Left eye: No discharge.      Conjunctiva/sclera: Conjunctivae normal.   Neck:      Trachea: No tracheal deviation.   Cardiovascular:      Rate and Rhythm: Normal rate.      Pulses:           Radial pulses are 2+ on the right side and 2+ on the left side.   Pulmonary:      Effort: Pulmonary effort is normal.   Abdominal:      General: Bowel sounds are normal.      Palpations: Abdomen is soft.   Genitourinary:  Musculoskeletal:         General: Normal range of motion.      Cervical back: Normal range of motion.   Lymphadenopathy:      Cervical: No cervical adenopathy.      Right cervical: No superficial cervical adenopathy.     Left cervical: No superficial cervical adenopathy.      Comments: No significant edema   Skin:     General: Skin is warm and dry.      Coloration: Skin is not pale.      Findings: No erythema or rash.   Neurological:      Mental Status: She is alert and oriented to person, place, and time.   Psychiatric:         Behavior: Behavior normal.         Thought Content: Thought content normal.               Assessment and Plan:  50 y.o. female with a very long history.  She has had multiple workups and testing.  There is pelvic floor dysfunction.  She has seen pelvic floor physical therapy for 6 months.  She did well with this, however did not find any results with this.     On exam today, I see grade three internal hemorrhoids.  These are quite large.  I have concerns about these being the issue for her today.  She describes that she cannot have a BM pass and feels that there are hemorrhoids blocking the outlet.  I was up front with her that this would be rare and I do not think this is the cause.  She really does feel that this is her issue.  I gave her the risks of a hemorrhoidectomy and she does wish to proceed.  I am not sure this will fix her issues, but we can at least eliminate them as the cause.      If this does not resolve, then I would like to see a repeat MRI defecography.  The PPH may be acting as a fixed point for internal intussusception.      I do not think this is stenosis as there are normal endoscopy since the [facility].      All questions answered.    I spent an hour with her records this morning.

## 2020-10-31 ENCOUNTER — Encounter: Admit: 2020-10-31 | Discharge: 2020-10-31 | Payer: PRIVATE HEALTH INSURANCE

## 2020-10-31 DIAGNOSIS — M6289 Other specified disorders of muscle: Secondary | ICD-10-CM

## 2020-10-31 NOTE — Telephone Encounter
Pt called today asking about PFPT. She said she spoke to Dr. Cena Benton about this at her visit with him last week. She would like to pursue this if possible, but wasn't sure if she should start it before her hemorrhoid procedure with him in August, or wait until after.     He said he recommended to start prior to. I have placed this referral and have given her the number to schedule this.

## 2020-11-07 ENCOUNTER — Encounter: Admit: 2020-11-07 | Discharge: 2020-11-07 | Payer: PRIVATE HEALTH INSURANCE

## 2020-11-07 DIAGNOSIS — K219 Gastro-esophageal reflux disease without esophagitis: Secondary | ICD-10-CM

## 2020-11-07 NOTE — Telephone Encounter
Pt called today c/o what sounds like reflux. She said she has pain right after eating esp greasy foods. Dr. Cena Benton would like her to see GI. I have placed a referral for her. I left her a VM with their contact information. I told her it may be a few months before they can get her in. In the meantime she can call her PCP and see if they would like to adjust her Protonix dosage or add anything else to her regimen.

## 2020-12-03 ENCOUNTER — Encounter: Admit: 2020-12-03 | Discharge: 2020-12-03 | Payer: PRIVATE HEALTH INSURANCE

## 2020-12-03 DIAGNOSIS — K289 Gastrojejunal ulcer, unspecified as acute or chronic, without hemorrhage or perforation: Secondary | ICD-10-CM

## 2020-12-03 DIAGNOSIS — J449 Chronic obstructive pulmonary disease, unspecified: Secondary | ICD-10-CM

## 2020-12-03 DIAGNOSIS — J45909 Unspecified asthma, uncomplicated: Secondary | ICD-10-CM

## 2020-12-03 DIAGNOSIS — M549 Dorsalgia, unspecified: Secondary | ICD-10-CM

## 2020-12-03 DIAGNOSIS — K319 Disease of stomach and duodenum, unspecified: Secondary | ICD-10-CM

## 2020-12-11 ENCOUNTER — Encounter: Admit: 2020-12-11 | Discharge: 2020-12-11 | Payer: PRIVATE HEALTH INSURANCE

## 2020-12-12 ENCOUNTER — Encounter: Admit: 2020-12-12 | Discharge: 2020-12-12 | Payer: PRIVATE HEALTH INSURANCE

## 2020-12-12 ENCOUNTER — Ambulatory Visit: Admit: 2020-12-12 | Discharge: 2020-12-12 | Payer: PRIVATE HEALTH INSURANCE

## 2020-12-12 DIAGNOSIS — K289 Gastrojejunal ulcer, unspecified as acute or chronic, without hemorrhage or perforation: Secondary | ICD-10-CM

## 2020-12-12 DIAGNOSIS — M549 Dorsalgia, unspecified: Secondary | ICD-10-CM

## 2020-12-12 DIAGNOSIS — K319 Disease of stomach and duodenum, unspecified: Secondary | ICD-10-CM

## 2020-12-12 DIAGNOSIS — J45909 Unspecified asthma, uncomplicated: Secondary | ICD-10-CM

## 2020-12-12 DIAGNOSIS — J449 Chronic obstructive pulmonary disease, unspecified: Secondary | ICD-10-CM

## 2020-12-12 MED ORDER — LIDOCAINE (PF) 20 MG/ML (2 %) IJ SOLN
INTRAVENOUS | 0 refills | Status: DC
Start: 2020-12-12 — End: 2020-12-12
  Administered 2020-12-12: 16:00:00 80 mg via INTRAVENOUS

## 2020-12-12 MED ORDER — PROPOFOL 10 MG/ML IV EMUL 50 ML (INFUSION)(AM)(OR)
INTRAVENOUS | 0 refills | Status: DC
Start: 2020-12-12 — End: 2020-12-12

## 2020-12-12 MED ORDER — PROPOFOL 10 MG/ML IV EMUL 20 ML (INFUSION)(AM)(OR)
INTRAVENOUS | 0 refills | Status: DC
Start: 2020-12-12 — End: 2020-12-12
  Administered 2020-12-12: 16:00:00 125 ug/kg/min via INTRAVENOUS

## 2020-12-12 MED ORDER — PROPOFOL INJ 10 MG/ML IV VIAL
INTRAVENOUS | 0 refills | Status: DC
Start: 2020-12-12 — End: 2020-12-12
  Administered 2020-12-12: 16:00:00 30 mg via INTRAVENOUS
  Administered 2020-12-12: 16:00:00 50 mg via INTRAVENOUS

## 2020-12-12 MED ADMIN — SODIUM CHLORIDE 0.9 % IV SOLP [27838]: 250 mL | INTRAVENOUS | @ 16:00:00 | Stop: 2020-12-12 | NDC 00338004902

## 2020-12-12 MED ADMIN — BUPIVACAINE LIPOSOME (PF) 1.3 % (13.3 MG/ML) LINF SUSP [309618]: 20 mL | @ 16:00:00 | Stop: 2020-12-12 | NDC 65250026620

## 2020-12-12 MED ADMIN — BUPIVACAINE HCL 0.25 % (2.5 MG/ML) IJ SOLN [1222]: 20 mL | INTRAMUSCULAR | @ 16:00:00 | Stop: 2020-12-12 | NDC 63323046501

## 2020-12-12 MED FILL — HYDROCODONE-ACETAMINOPHEN 5-325 MG PO TAB: 5/325 mg | ORAL | 7 days supply | Qty: 50 | Fill #1 | Status: CP

## 2020-12-14 ENCOUNTER — Encounter: Admit: 2020-12-14 | Discharge: 2020-12-14 | Payer: PRIVATE HEALTH INSURANCE

## 2020-12-14 DIAGNOSIS — J45909 Unspecified asthma, uncomplicated: Secondary | ICD-10-CM

## 2020-12-14 DIAGNOSIS — M549 Dorsalgia, unspecified: Secondary | ICD-10-CM

## 2020-12-14 DIAGNOSIS — J449 Chronic obstructive pulmonary disease, unspecified: Secondary | ICD-10-CM

## 2020-12-14 DIAGNOSIS — K319 Disease of stomach and duodenum, unspecified: Secondary | ICD-10-CM

## 2020-12-14 DIAGNOSIS — K289 Gastrojejunal ulcer, unspecified as acute or chronic, without hemorrhage or perforation: Secondary | ICD-10-CM

## 2021-01-03 ENCOUNTER — Encounter: Admit: 2021-01-03 | Discharge: 2021-01-03 | Payer: PRIVATE HEALTH INSURANCE

## 2021-01-03 NOTE — Telephone Encounter
Left message to reschedule appt on 09/09 that was cancelled through [facility]. Gave OOC number to cb

## 2021-04-19 ENCOUNTER — Encounter: Admit: 2021-04-19 | Discharge: 2021-04-19 | Payer: PRIVATE HEALTH INSURANCE

## 2021-04-19 NOTE — Telephone Encounter
04/19/21 - Records have been requested per work que task / sjg  ____________________________    [facility] [place] Phone: [phone]    [place] Phone: [phone]

## 2021-04-23 ENCOUNTER — Encounter: Admit: 2021-04-23 | Discharge: 2021-04-23 | Payer: PRIVATE HEALTH INSURANCE

## 2021-04-23 ENCOUNTER — Ambulatory Visit: Admit: 2021-04-23 | Discharge: 2021-04-24 | Payer: MEDICAID

## 2021-04-23 DIAGNOSIS — Z8719 Personal history of other diseases of the digestive system: Secondary | ICD-10-CM

## 2021-04-23 DIAGNOSIS — K289 Gastrojejunal ulcer, unspecified as acute or chronic, without hemorrhage or perforation: Secondary | ICD-10-CM

## 2021-04-23 DIAGNOSIS — R0683 Snoring: Secondary | ICD-10-CM

## 2021-04-23 DIAGNOSIS — K319 Disease of stomach and duodenum, unspecified: Secondary | ICD-10-CM

## 2021-04-23 DIAGNOSIS — R400 Somnolence: Secondary | ICD-10-CM

## 2021-04-23 DIAGNOSIS — J449 Chronic obstructive pulmonary disease, unspecified: Secondary | ICD-10-CM

## 2021-04-23 DIAGNOSIS — M549 Dorsalgia, unspecified: Secondary | ICD-10-CM

## 2021-04-23 DIAGNOSIS — R55 Syncope and collapse: Secondary | ICD-10-CM

## 2021-04-23 DIAGNOSIS — J45909 Unspecified asthma, uncomplicated: Secondary | ICD-10-CM

## 2021-04-23 DIAGNOSIS — R5383 Other fatigue: Secondary | ICD-10-CM

## 2021-04-23 DIAGNOSIS — F419 Anxiety disorder, unspecified: Secondary | ICD-10-CM

## 2021-04-23 MED ORDER — PANTOPRAZOLE 40 MG PO TBEC
40 mg | ORAL_TABLET | Freq: Two times a day (BID) | ORAL | 1 refills | 90.00000 days | Status: AC
Start: 2021-04-23 — End: ?

## 2021-04-23 MED ORDER — AMITRIPTYLINE 25 MG PO TAB
25 mg | ORAL_TABLET | Freq: Every evening | ORAL | 1 refills | Status: AC
Start: 2021-04-23 — End: ?

## 2021-04-23 NOTE — Progress Notes
Date of Service: 04/23/2021    Katelyn Wu is a 50 y.o. female.       HPI     Patient is a 50 year old female past medical history of anxiety disorder, chronic back pain, history of chronic gastroesophageal reflux disease with esophagitis [place] had previous fundoplication and previous ventral hernia repair.  Reports that surgery and outcomes were not good for her.  She has chronic difficulty with sleeping where she will fall asleep but then wake up frequently throughout the night and does not feel well rested during the day.  Has profound fatigue and does snore.  Notes that she will initially have some chest discomfort when she is laying flat and does prefer to be somewhat elevated.  Does not have true orthopnea or PND type symptoms.  Does not struggle with edema or signs of fluid retention.  Has had troubles with erratic blood pressures especially in the diastolic side of things.  Has had issues of near syncope as well as syncopal episodes have been ongoing for several years.  Had upper respiratory symptoms with cough and phlegm production at the end of November and into December.  Really does not recovered since that time.  Has chronic coughing and has been on inhalers for suspected asthma.  She has had normal pulmonary function test.  Symptoms or not improved with her inhalers.  Recently because of the near syncopal and syncopal episodes underwent echocardiogram, chest CT angiogram, chest x-ray, lab work, and nuclear cardiac stress test.  The echocardiogram showed borderline left ventricular ejection fraction with no regional wall motion abnormalities or other cardiac function abnormalities.  The measurements of the level of the wall shows mild hypertrophy with a predominant basal septal increased measurement.  Cardiac enlargement was not recognized on any other studies.  ECG shows sinus rhythm with borderline LVH by voltage with no repolarization abnormalities.  She had previous cardiac evaluation including Holter monitor and tilt table study from previous issues with lightheadedness and syncope in the past that were essentially normal.       Vitals:    04/23/21 0827   BP: 122/84   BP Source: Arm, Left Upper   Pulse: 92   SpO2: 97%   O2 Percent: 97 %   O2 Device: None (Room air)   PainSc: Zero   Weight: 85.1 kg (187 lb 9.6 oz)   Height: 170.2 cm (5' 7)     Body mass index is 29.38 kg/m?Marland Kitchen     Past Medical History  Patient Active Problem List    Diagnosis Date Noted   ? Anxiety 04/23/2021   ? Chronic back pain 04/23/2021   ? Asthma 04/23/2021   ? Gastro-esophageal reflux disease without esophagitis 04/23/2021   ? Pelvic pain 06/07/2018     Pelvic exam 06/07/18 ~ left posterior vaginal tenderness with exam     ? Kidney stone on left side 06/07/2018     Noted on outside CT scan without contrast 01/01/18     ? Incomplete bladder emptying 06/07/2018     06/07/18   1st PVR =  2nd PVR = 56mL     ? Pelvic floor dysfunction in female 03/24/2018   ? Chronic constipation 03/24/2018   ? Other hemorrhoids 03/24/2018   ? Tubular adenoma polyp of rectum 03/24/2018   ? Rectocele 03/24/2018   ? 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 Abdominal distention 03/24/2018   ? Rectal pain 03/24/2018   ? BRBPR (bright red blood per rectum) 03/24/2018   ?  Urinary frequency 03/17/2018     Urology Consult 06/07/18  Reports increased urinary frequency and nocturia 09/2017  History of IC outside Urology > 10 years ago ~ no records for review  Taking oxybutynin 5mg  BID? XL ?    PVR 122cc, 2nd PVR 56cc     ? 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   HENT: Negative.    Eyes: Negative.    Cardiovascular: Positive for chest pain, dyspnea on exertion and orthopnea.   Respiratory: Positive for shortness of breath and sleep disturbances due to breathing.    Endocrine: Negative.    Hematologic/Lymphatic: Negative.    Skin: Negative.    Musculoskeletal: Positive for muscle weakness and neck pain.   Gastrointestinal: Negative.    Genitourinary: Negative.    Neurological: Positive for dizziness, headaches, light-headedness and weakness.   Psychiatric/Behavioral: Negative.    Allergic/Immunologic: Negative.        Physical Exam  Patient is awake and alert but appears very tired and somewhat anxious  Pupils are equal reactive without scleral injection, she does have dark circles around her eyes  Neck is supple no carotid stroke and no bruits.  No jugular venous distention or findings for hepatojugular reflux changes  Lungs are clear to auscultation without focal crackles or wheezes  Heart S1, S2 that are normal.  No murmurs, clicks, or gallops.  Valsalva maneuver and hand grasp were performed without change in heart sounds  Abdomen is obese and tympanic with mild protuberance.  Previous scars from prior surgeries but otherwise symmetric no outpouching or focal tenderness  Pulse are 2+, regular, symmetric bilaterally radial as well as dorsalis pedis and posterior tibial locations  No significant peripheral edema or obvious skin abnormalities with normal skin tone and symmetric muscle tone.    Cardiovascular Studies      Cardiovascular Health Factors  Vitals BP Readings from Last 3 Encounters:   04/23/21 122/84   12/12/20 133/87   10/26/20 128/88     Wt Readings from Last 3 Encounters:   04/23/21 85.1 kg (187 lb 9.6 oz)   12/12/20 77 kg (169 lb 12.8 oz)   10/26/20 80.7 kg (178 lb)     BMI Readings from Last 3 Encounters:   04/23/21 29.38 kg/m?   12/12/20 26.59 kg/m?   10/26/20 28.78 kg/m?      Smoking Social History     Tobacco Use   Smoking Status Never   Smokeless Tobacco Never      Lipid Profile No results found for: CHOL  No results found for: HDL  No results found for: LDL  No results found for: TRIG   Blood Sugar No results found for: HGBA1C  Glucose   Date Value Ref Range Status   02/10/2018 89 70 - 100 MG/DL Final   29/56/2130 89 70 - 100 MG/DL Final   86/57/8469 90 70 - 100 MG/DL Final          Problems Addressed Today  Encounter Diagnoses   Name Primary?   ? Anxiety Yes   ? Fatigue, unspecified type    ? Daytime somnolence    ? Snoring    ? Hx of esophagitis    ? Syncope, unspecified syncope type        Assessment and Plan     This is noncardiac chest pain.  No findings for cardiac etiology for syncope.  I do not believe her  shortness of breath and fatigue are related to a cardiac origin.  I do have concerns for her sleep disorder as well is ongoing esophagitis that is suboptimally treated.  At this time she can stop lisinopril and inhalers.  I would like to increase her Protonix to 40 mg p.o. twice daily for at least a month to see if this alleviates some of the cough and chest discomfort that she is prone to.  We will arrange for home sleep study to evaluate for sleep apnea based upon her symptoms and findings.  We will initiate amitriptyline 25 mg p.o. nightly to assist with more effective sleep and anxiety symptoms.  She should contact her office within the next month there is any concerns or issues regarding the medicine or if are unable to complete the home sleep study.  Otherwise I would like to reach out to her once we have the results of sleep study and determine further treatment or management going forward.         Current Medications (including today's revisions)  ? amitriptyline (ELAVIL) 25 mg tablet Take one tablet by mouth at bedtime daily.   ? CHOLEcalciferoL (vitamin D3) (OPTIMAL D3) 50,000 units capsule Take 50,000 Units by mouth every 7 days.   ? lisinopril (ZESTRIL) 5 mg tablet Take 5 mg by mouth daily.   ? pantoprazole DR (PROTONIX) 40 mg tablet Take one tablet by mouth twice daily.   ? YUVAFEM 10 mcg vaginal tablet Insert or Apply  to vaginal area twice weekly.

## 2021-04-23 NOTE — Patient Instructions
Stop inhalers  Stop Lisinopril  Change Protonix to twice daily  Start Amitrityline,25 mg every night   Arrange for home sleep study  Will call with results

## 2021-04-30 ENCOUNTER — Encounter: Admit: 2021-04-30 | Discharge: 2021-04-30 | Payer: PRIVATE HEALTH INSURANCE

## 2021-05-23 ENCOUNTER — Encounter: Admit: 2021-05-23 | Discharge: 2021-05-23 | Payer: PRIVATE HEALTH INSURANCE

## 2021-05-24 ENCOUNTER — Ambulatory Visit: Admit: 2021-05-24 | Discharge: 2021-05-25 | Payer: MEDICAID

## 2021-05-24 ENCOUNTER — Encounter: Admit: 2021-05-24 | Discharge: 2021-05-24 | Payer: PRIVATE HEALTH INSURANCE

## 2021-05-24 DIAGNOSIS — K319 Disease of stomach and duodenum, unspecified: Secondary | ICD-10-CM

## 2021-05-24 DIAGNOSIS — R1310 Dysphagia, unspecified: Secondary | ICD-10-CM

## 2021-05-24 DIAGNOSIS — M6289 Other specified disorders of muscle: Secondary | ICD-10-CM

## 2021-05-24 DIAGNOSIS — K289 Gastrojejunal ulcer, unspecified as acute or chronic, without hemorrhage or perforation: Secondary | ICD-10-CM

## 2021-05-24 DIAGNOSIS — R140 Abdominal distension (gaseous): Secondary | ICD-10-CM

## 2021-05-24 DIAGNOSIS — K5909 Other constipation: Secondary | ICD-10-CM

## 2021-05-24 DIAGNOSIS — D128 Benign neoplasm of rectum: Secondary | ICD-10-CM

## 2021-05-24 DIAGNOSIS — J449 Chronic obstructive pulmonary disease, unspecified: Secondary | ICD-10-CM

## 2021-05-24 DIAGNOSIS — K9289 Other specified diseases of the digestive system: Secondary | ICD-10-CM

## 2021-05-24 DIAGNOSIS — M549 Dorsalgia, unspecified: Secondary | ICD-10-CM

## 2021-05-24 DIAGNOSIS — J45909 Unspecified asthma, uncomplicated: Secondary | ICD-10-CM

## 2021-05-24 DIAGNOSIS — N816 Rectocele: Secondary | ICD-10-CM

## 2021-05-24 DIAGNOSIS — K561 Intussusception: Secondary | ICD-10-CM

## 2021-05-24 NOTE — Patient Instructions
Try to find old GI records to forward.  Continue current meds  Will review records once received to help determine official dx and Plan of care moving forward.      If you have questions or concerns that need to be addressed before your next scheduled office visit, please send me a MyChart patient message, or call my nurse at [phone].

## 2021-05-25 DIAGNOSIS — K219 Gastro-esophageal reflux disease without esophagitis: Secondary | ICD-10-CM

## 2021-05-28 ENCOUNTER — Encounter: Admit: 2021-05-28 | Discharge: 2021-05-28 | Payer: PRIVATE HEALTH INSURANCE

## 2021-06-04 ENCOUNTER — Encounter: Admit: 2021-06-04 | Discharge: 2021-06-04 | Payer: PRIVATE HEALTH INSURANCE

## 2021-06-20 ENCOUNTER — Encounter: Admit: 2021-06-20 | Discharge: 2021-06-20 | Payer: PRIVATE HEALTH INSURANCE

## 2021-06-24 ENCOUNTER — Encounter: Admit: 2021-06-24 | Discharge: 2021-06-24 | Payer: PRIVATE HEALTH INSURANCE

## 2021-06-24 NOTE — Progress Notes
Pt seen in ED in [place] for SOA.  Per patient CXR showed HF, offered pt appointment for follow up with Dr. Joya San in [facility] office. Pt confirmed appt time and location.

## 2021-06-26 ENCOUNTER — Encounter: Admit: 2021-06-26 | Discharge: 2021-06-26 | Payer: PRIVATE HEALTH INSURANCE

## 2021-06-26 DIAGNOSIS — K319 Disease of stomach and duodenum, unspecified: Secondary | ICD-10-CM

## 2021-06-26 DIAGNOSIS — R0602 Shortness of breath: Secondary | ICD-10-CM

## 2021-06-26 DIAGNOSIS — E782 Mixed hyperlipidemia: Secondary | ICD-10-CM

## 2021-06-26 DIAGNOSIS — K561 Intussusception: Secondary | ICD-10-CM

## 2021-06-26 DIAGNOSIS — I5032 Chronic diastolic (congestive) heart failure: Secondary | ICD-10-CM

## 2021-06-26 DIAGNOSIS — M549 Dorsalgia, unspecified: Secondary | ICD-10-CM

## 2021-06-26 DIAGNOSIS — I10 Essential (primary) hypertension: Secondary | ICD-10-CM

## 2021-06-26 DIAGNOSIS — J45909 Unspecified asthma, uncomplicated: Secondary | ICD-10-CM

## 2021-06-26 DIAGNOSIS — J449 Chronic obstructive pulmonary disease, unspecified: Secondary | ICD-10-CM

## 2021-06-26 DIAGNOSIS — Z136 Encounter for screening for cardiovascular disorders: Secondary | ICD-10-CM

## 2021-06-26 DIAGNOSIS — K289 Gastrojejunal ulcer, unspecified as acute or chronic, without hemorrhage or perforation: Secondary | ICD-10-CM

## 2021-06-26 MED ORDER — FUROSEMIDE 40 MG PO TAB
40 mg | ORAL_TABLET | Freq: Every morning | ORAL | 3 refills | 90.00000 days | Status: AC
Start: 2021-06-26 — End: ?

## 2021-06-26 MED ORDER — POTASSIUM CHLORIDE 20 MEQ PO TBTQ
20 meq | ORAL_TABLET | Freq: Every day | ORAL | 3 refills | 30.00000 days | Status: AC
Start: 2021-06-26 — End: ?

## 2021-06-26 MED ORDER — ATORVASTATIN 20 MG PO TAB
20 mg | ORAL_TABLET | Freq: Every day | ORAL | 1 refills | Status: AC
Start: 2021-06-26 — End: ?

## 2021-06-26 MED ORDER — NITROGLYCERIN 0.4 MG SL SUBL
.4 mg | ORAL_TABLET | SUBLINGUAL | 3 refills | 9.00000 days | Status: AC | PRN
Start: 2021-06-26 — End: ?

## 2021-06-26 MED ORDER — ASPIRIN 81 MG PO TBEC
81 mg | ORAL_TABLET | Freq: Every day | ORAL | 1 refills | Status: AC
Start: 2021-06-26 — End: ?

## 2021-06-26 MED ORDER — METOPROLOL SUCCINATE 25 MG PO TB24
25 mg | ORAL_TABLET | Freq: Every day | ORAL | 3 refills | 90.00000 days | Status: AC
Start: 2021-06-26 — End: ?

## 2021-06-26 NOTE — Progress Notes
Date of Service: 06/26/2021    Turkessa Hennen is a 51 y.o. female.       CC: Ms. 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  Her history dates back to the last 1 year.  She has been having significant shortness of breath during this timeframe which has been progressive.  Initially this was related to half a block dyspnea and now it has progressed to a modest extent.  Overall, she states that over the last 1 year she has been feeling poorly in terms of shortness of breath and occasional atypical anginal symptoms.  She visited [place] and and during that time she had significant shortness of breath.  This was further evaluated at [facility].  In November 2022, she underwent a visit to the emergency room and was told that she had COPD.  She has no history of smoking or occupational exposure.  She has never seen a pulmonologist and is fairly convinced that she does not have any underlying COPD.  She had progressive shortness of breath for which she visited the emergency room again at [facility] in December where she was reported that she had significant amount of fluid in her lungs attributed to congestive heart failure.  She was placed on hydrochlorothiazide and subsequently discharged from the emergency room.  She underwent a nuclear stress test which showed no evidence of scintigraphic ischemia and an underlying normal LVEF with no adverse prognostic factors.  Additionally, echocardiogram demonstrated borderline LV function of 50%, normal diastology, valvular function and wall motion pattern.  No significant pericardial effusion noted.  She also underwent a CTA to rule out a pulmonary embolus.  This was not gated for coronary arteries.  She continues to have progressive shortness of breath and significant lightheadedness.  Previously, she has also had a couple of Holter monitors which have shown no evidence of sustained runs of SVT/VT or high-grade AV blocks.  She is presently not on any AV nodal blockers.  No history of thyroid dysfunction.  No premature CAD in the family.  Fluctuant blood pressures noted at home.  Presently not being treated for hyperlipidemia.  No underlying type 2 diabetes mellitus.  No premature CAD in the family.  No prior cardiac catheterizations or open heart surgery.    Last known echocardiogram, stress test: Please see above  Cardiac catheterization: Never had one done  Twelve-lead UEA:[id number] sinus rhythm, normal R wave progression, no acute ischemic changes, no atrial or ventricular arrhythmias.  Normal intervals identified.    Assessment    1.  Congestive heart failure, chronic diastolic, NYHA class II, ACC AHA stage C  2.  Benign essential hypertension  3.  Mixed hyperlipidemia    Briefly, 51 year old woman presenting with heart failure symptoms.  No underlying history suggestive of COPD such as smoking or passive smoking/exposure to occupational hazards.  She has been on hydrochlorothiazide which has produced poor effect.  Mild amount of volume overload noted on examination.  Atherosclerotic risk factors include hypertension, hyperlipidemia and family history of coronary artery disease.  Her echocardiogram does not suggest significant or profound diastolic dysfunction.  It is unclear whether exercise induces diastolic dysfunction in her.  She has not had a trial of loop diuretics either.  The CTA which  was performed was not gated for assessing her coronary arteries.  Nuclear stress test showed no evidence of ischemia however.    Recommendations    1.  Aspirin 81 mg daily for primary prevention purposes.  Low bleeding risk candidate  2.  Toprol-XL 25 mg once daily for improving diastolic function  3.  Fluctuant blood pressures noted, adding beta-blockers would likely result in reasonable control of her blood pressures  4.  Target blood pressure less than 140/90 mmHg.  5.  Recommend stopping hydrochlorothiazide  6.  Recommend starting Lasix 40 mg once daily along with potassium supplements 20 mEq/day  7.  Repeat basic metabolic profile in 1 week  8.  Lipitor 20 nightly to target an LDL less than 70 mg/dL.  Present LDL 132 mg/dL with ASCVD score greater than 7.5%.  9.  If she fails therapeutic intervention with Lasix and beta-blockers, it would be reasonable to consider a right heart catheterization to rule out underlying pulmonary hypertension and assess her filling pressures along with a coronary angiogram to definitively rule out underlying obstructive coronary artery disease.  I explained to her that stress test carry up to a 10 to 15% false negative rate and if she does have continual heart failure symptoms which are not explained otherwise by valvular dysfunction or other etiologies, we could consider proceeding along the invasive route to definitively rule out underlying cardiac causes of her symptoms.  10.  She has already had an EGD in the past for hiatal hernia resulting in dilation in the past.  11.  Previous Holter monitor was reviewed and no high-grade SVT/VT/AV blocks have been identified.         Vitals:    06/26/21 0816   BP: 116/70   BP Source: Arm, Left Upper   Pulse: 97   SpO2: 98%   O2 Device: None (Room air)   PainSc: Zero   Weight: 80.4 kg (177 lb 3.2 oz)   Height: 170.2 cm (5' 7)     Body mass index is 27.75 kg/m?Marland Kitchen     Past Medical History  Patient Active Problem List    Diagnosis Date Noted   ? Anxiety 04/23/2021   ? Chronic back pain 04/23/2021   ? Asthma 04/23/2021   ? Gastro-esophageal reflux disease without esophagitis 04/23/2021   ? Pelvic pain 06/07/2018     Pelvic exam 06/07/18 ~ left posterior vaginal tenderness with exam     ? Kidney stone on left side 06/07/2018     Noted on outside CT scan without contrast 01/01/18     ? Incomplete bladder emptying 06/07/2018     06/07/18   1st PVR =  2nd PVR = 56mL     ? Pelvic floor dysfunction in female 03/24/2018   ? Chronic constipation 03/24/2018   ? Other hemorrhoids 03/24/2018   ? Tubular adenoma polyp of rectum 03/24/2018   ? Rectocele 03/24/2018   ? 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 Abdominal distention 03/24/2018   ? Rectal pain 03/24/2018   ? BRBPR (bright red blood per rectum) 03/24/2018   ? Urinary frequency 03/17/2018     Urology Consult 06/07/18  Reports increased urinary frequency and nocturia 09/2017  History of IC outside Urology > 10 years ago ~ no records for review  Taking oxybutynin 5mg  BID? XL ?    PVR 122cc, 2nd PVR 56cc     ? 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   HENT: Negative.    Eyes: Negative.    Cardiovascular: Negative.    Respiratory: Negative.    Endocrine: Negative.    Hematologic/Lymphatic: Negative.    Skin: Negative.    Musculoskeletal: Negative.    Gastrointestinal: Negative.    Genitourinary: Negative.    Neurological: Negative.    Psychiatric/Behavioral: Negative.    Allergic/Immunologic: Negative.        Physical Exam  Physical Exam     General Appearance: moderately overweight, no distress   Skin: warm, no ulcers or xanthomas; few ecchymoses   Eyes: conjunctivae and lids normal, pupils are equal and round   Neck Veins: Mildly elevated JVP , neck veins are mildly distended   Thyroid: no nodules, masses, tenderness or enlargement   Cardiovascular system: Pulse 86/min, regular rhythm , normal volume, no specific character, felt equally in all peripheries and there was no radio femoral delay.  PMI undisplaced, no palpable thrills/heaves, S1 S2 heard, no murmurs, no rub, mild jugular venous distension, no carotid bruit.  Peripheral Circulation: normal peripheral circulation   Pedal Pulses: normal symmetric pedal pulses   Carotid Arteries: normal carotid upstroke bilaterally, no bruits   Respiratory system: No acute distress  No use of accessory muscles  Normal vesicular breath sounds over all the lung fields bilaterally  No added sounds    Cardiovascular Studies      Cardiovascular Health Factors  Vitals BP Readings from Last 3 Encounters:   06/26/21 116/70   04/23/21 122/84   12/12/20 133/87     Wt Readings from Last 3 Encounters:   06/26/21 80.4 kg (177 lb 3.2 oz)   04/23/21 85.1 kg (187 lb 9.6 oz)   12/12/20 77 kg (169 lb 12.8 oz)     BMI Readings from Last 3 Encounters:   06/26/21 27.75 kg/m?   04/23/21 29.38 kg/m?   12/12/20 26.59 kg/m?      Smoking Social History     Tobacco Use   Smoking Status Never   Smokeless Tobacco Never      Lipid Profile Cholesterol   Date Value Ref Range Status   10/18/2020 215  Final     HDL   Date Value Ref Range Status   10/18/2020 70  Final     LDL   Date Value Ref Range Status   10/18/2020 132  Final     Triglycerides   Date Value Ref Range Status   10/18/2020 65  Final      Blood Sugar No results found for: HGBA1C  Glucose   Date Value Ref Range Status   04/21/2021 86  Final   02/10/2018 89 70 - 100 MG/DL Final   16/01/9603 89 70 - 100 MG/DL Final            Current Medications (including today's revisions)  ? amitriptyline (ELAVIL) 25 mg tablet Take one tablet by mouth at bedtime daily.   ? CHOLEcalciferoL (vitamin D3) (OPTIMAL D3) 50,000 units capsule Take one capsule by mouth every 7 days.   ? hydroCHLOROthiazide (HYDRODIURIL) 25 mg tablet Take one tablet by mouth as Needed.   ? lansoprazole DR (PREVACID) 30 mg capsule Take one capsule by mouth daily.   ? lisinopril (ZESTRIL) 5 mg tablet Take 5 mg by mouth daily.   ? pantoprazole DR (PROTONIX) 40 mg tablet Take one tablet by mouth twice daily.   ? TRELEGY ELLIPTA 200-62.5-25 mcg inhaler INHALE 1 PUFF ONCE DAILY   ? YUVAFEM 10 mcg vaginal tablet  Insert or Apply  to vaginal area twice weekly.

## 2021-06-26 NOTE — Patient Instructions
Thank you for visiting our office today.    We would like to make the following medication adjustments:      Nitro 0.4 as needed  Aspirin 81mg  daily  Lipitor 20mg  daily  Toprol XL 25mg  daily  Lasix 40mg  daily  Potassium 19mEq (with lasix)    **Labs in 1 week**    Keep tracking of weight, blood pressure, and heart rate        Otherwise continue the same medications as you have been doing.          We will be pursuing the following tests after your appointment today:       Orders Placed This Encounter    BASIC METABOLIC PANEL    aspirin EC (ASPIR-LOW) 81 mg tablet    nitroglycerin (NITROSTAT) 0.4 mg tablet    metoprolol succinate XL (TOPROL XL) 25 mg extended release tablet    atorvastatin (LIPITOR) 20 mg tablet    furosemide (LASIX) 40 mg tablet    potassium chloride SR (KLOR-CON M20) 20 mEq tablet         Please call us in the meantime with any questions or concerns.        Please allow 5-7 business days for our providers to review your results. All normal results will go to MyChart. If you do not have Mychart, it is strongly recommended to get this so you can easily view all your results. If you do not have mychart, we will attempt to call you once with normal lab and testing results. If we cannot reach you by phone with normal results, we will send you a letter.  If you have not heard the results of your testing after one week please give [place] a call.       Your Cardiovascular Medicine [place] Team Richardson Landry, Rene Kocher, Threasa Beards, and [place])  phone number is [phone].

## 2021-07-01 ENCOUNTER — Encounter: Admit: 2021-07-01 | Discharge: 2021-07-01 | Payer: PRIVATE HEALTH INSURANCE

## 2021-07-01 DIAGNOSIS — R0683 Snoring: Secondary | ICD-10-CM

## 2021-07-01 NOTE — Progress Notes
Patient does not need a follow up with a Sleep provider at this time.

## 2021-07-01 NOTE — Telephone Encounter
Katelyn Wu called into the nursing line and states that she is having trouble with the lasix.  She states she has been taking it for about a week and is having issues with dizziness and fatigue.  She states that she decreased her dose to Lasix '20mg'$  daily, but she continued to feel dizzy all day.  She did not have any syncopal episodes, but thought she might pass out at times because she felt so tired.  She did not take her lasix today and does not have any dizziness.  Her BP is running around 149/85.  She states that her weight is down between 5-10 pounds.  She has noticed an improvement in her swelling, but no change in her shortness of breath    Will route to Dr. Glynda Jaeger for recommendations.

## 2021-07-01 NOTE — Telephone Encounter
Discussed patient's symptoms with TLR in clinic. He recommends two options. Patient could either go to the local ER/[place] [facility], or go to the [place] [facility] to have an EKG completed.       Called and discussed Dr. Loletta Parish recommendations with patient. Patient states that she will go to the ER and has no further questions at this time.

## 2021-07-02 ENCOUNTER — Encounter: Admit: 2021-07-02 | Discharge: 2021-07-02 | Payer: PRIVATE HEALTH INSURANCE

## 2021-07-04 ENCOUNTER — Encounter: Admit: 2021-07-04 | Discharge: 2021-07-04 | Payer: PRIVATE HEALTH INSURANCE

## 2021-07-05 ENCOUNTER — Ambulatory Visit: Admit: 2021-07-05 | Discharge: 2021-07-05 | Payer: PRIVATE HEALTH INSURANCE

## 2021-07-05 DIAGNOSIS — R1310 Dysphagia, unspecified: Secondary | ICD-10-CM

## 2021-07-08 ENCOUNTER — Encounter: Admit: 2021-07-08 | Discharge: 2021-07-08 | Payer: MEDICAID

## 2021-07-08 ENCOUNTER — Encounter: Admit: 2021-07-08 | Discharge: 2021-07-08 | Payer: PRIVATE HEALTH INSURANCE

## 2021-07-08 DIAGNOSIS — K6289 Other specified diseases of anus and rectum: Secondary | ICD-10-CM

## 2021-07-08 DIAGNOSIS — J45909 Unspecified asthma, uncomplicated: Secondary | ICD-10-CM

## 2021-07-08 DIAGNOSIS — J449 Chronic obstructive pulmonary disease, unspecified: Secondary | ICD-10-CM

## 2021-07-08 DIAGNOSIS — M549 Dorsalgia, unspecified: Secondary | ICD-10-CM

## 2021-07-08 DIAGNOSIS — K319 Disease of stomach and duodenum, unspecified: Secondary | ICD-10-CM

## 2021-07-08 DIAGNOSIS — K289 Gastrojejunal ulcer, unspecified as acute or chronic, without hemorrhage or perforation: Secondary | ICD-10-CM

## 2021-07-08 NOTE — Progress Notes
Name: Katelyn Wu          MRN: [id number]      DOB: 1971/03/29      AGE: 51 y.o.   DATE OF SERVICE: 07/08/2021    Subjective:             Reason for Visit:  Follow Up      [name] is a 51 y.o. female.      Cancer Staging   No matching staging information was found for the patient.    History of Present Illness  51 y.o. female with long medical history.  She is describing that she is having issues with swallowing.  She had surgery for acid reflux in the past.  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      She is noting that her bowel movements are more normal.  She is noting that this is better after surgery.      Most of her complaints are about her esophagus and swallowing issues.        Medical History:   Diagnosis Date   ? Asthma    ? Back pain    ? COPD mixed type (HCC)     and asthma   ? Stomach problems    ? Ulcer of the stomach and intestine      Surgical History:   Procedure Laterality Date   ? ANORECTAL MANOMETRY N/A 06/18/2017    Performed by Jolee Ewing, MD at [facility] ENDO   ? flex sig N/A 07/23/2017    Performed by Jolee Ewing, MD at [facility] OR   ? ROBOT VENTRAL MESH RECTOPEXY N/A 02/08/2018    Performed by Benetta Spar, MD at [facility] OR   ? REPAIR RECTOCELE  02/08/2018    Performed by Benetta Spar, MD at [facility] OR   ? EGD N/A 04/16/2018    Performed by Remigio Eisenmenger, MD at [facility] END[facility]   ? ESOPHAGOGASTRODUODENOSCOPY WITH BIOPSY - FLEXIBLE  04/16/2018    Performed by Remigio Eisenmenger, MD at [facility] ENDO   ? HEMORRHOIDECTOMY - 2 OR MORE COLUMNS/ GROUPS - INTERNAL AND EXTERNAL N/A 12/12/2020    Performed by Costella Hatcher, DO at [facility] OR   ? HX HYSTERECTOMY     ? HYSTERECTOMY      2009   ? TUBAL LIGATION       Family History   Problem Relation Age of Onset   ? Coronary Artery Disease Mother    ? Heart Disease Mother    ? Heart Surgery Mother      Social History     Socioeconomic History   ? Marital status: Single   Occupational History     Comment: paraprofessional   Tobacco Use   ? Smoking status: Never   ? Smokeless tobacco: Never   Vaping Use   ? Vaping Use: Never used   Substance and Sexual Activity   ? Alcohol use: No   ? Drug use: No   ? Sexual activity: Not Currently     Vaping/E-liquid Use   ? Vaping Use Never User                      Review of Systems   Constitutional: Negative.    HENT: Negative.    Eyes: Negative.    Respiratory: Negative.    Cardiovascular: Negative.    Gastrointestinal: Positive for nausea.  Diff swallowing and early satiety.     Endocrine: Negative.    Genitourinary: Negative.    Musculoskeletal: Negative.    Skin: Negative.    Allergic/Immunologic: Negative.    Neurological: Negative.    Hematological: Negative.    Psychiatric/Behavioral: Negative.          Objective:         ? amitriptyline (ELAVIL) 25 mg tablet Take one tablet by mouth at bedtime daily.   ? aspirin EC (ASPIR-LOW) 81 mg tablet Take one tablet by mouth daily. Take with food.   ? atorvastatin (LIPITOR) 20 mg tablet Take one tablet by mouth daily.   ? CHOLEcalciferoL (vitamin D3) (OPTIMAL D3) 50,000 units capsule Take one capsule by mouth every 7 days.   ? furosemide (LASIX) 40 mg tablet Take one tablet by mouth every morning.   ? lansoprazole DR (PREVACID) 30 mg capsule Take one capsule by mouth daily.   ? metoprolol succinate XL (TOPROL XL) 25 mg extended release tablet Take one tablet by mouth daily.   ? nitroglycerin (NITROSTAT) 0.4 mg tablet Place one tablet under tongue every 5 minutes as needed for Chest Pain. Max of 3 tablets, call 911.   ? potassium chloride SR (KLOR-CON M20) 20 mEq tablet Take one tablet by mouth daily. Take with a meal and a full glass of water.   ? TRELEGY ELLIPTA 200-62.5-25 mcg inhaler INHALE 1 PUFF ONCE DAILY   ? YUVAFEM 10 mcg vaginal tablet Insert or Apply  to vaginal area twice weekly.     Vitals:    07/08/21 0818   Resp: 18   PainSc: Zero   Weight: 78.7 kg (173 lb 9.6 oz)   Height: 170.2 cm (5' 7)     Body mass index is 27.19 kg/m?Marland Kitchen     Pain Score: Zero       Fatigue Scale: 8    Pain Addressed:  N/A    Patient Evaluated for a Clinical Trial: No treatment clinical trial available for this patient.     [place] Cooperative Oncology Group performance status is 0, Fully active, able to carry on all pre-disease performance without restriction.Marland Kitchen     Physical Exam  Vitals reviewed.   Constitutional:       Appearance: She is well-developed.   HENT:      Head: Normocephalic and atraumatic.   Eyes:      General: No scleral icterus.        Right eye: No discharge.         Left eye: No discharge.      Conjunctiva/sclera: Conjunctivae normal.   Neck:      Trachea: No tracheal deviation.   Cardiovascular:      Rate and Rhythm: Normal rate.      Pulses:           Radial pulses are 2+ on the right side and 2+ on the left side.   Pulmonary:      Effort: Pulmonary effort is normal.   Abdominal:      General: Bowel sounds are normal.      Palpations: Abdomen is soft.   Musculoskeletal:         General: Normal range of motion.      Cervical back: Normal range of motion.   Lymphadenopathy:      Cervical: No cervical adenopathy.      Right cervical: No superficial cervical adenopathy.     Left cervical: No superficial cervical adenopathy.  Comments: No significant edema   Skin:     General: Skin is warm and dry.      Coloration: Skin is not pale.      Findings: No erythema or rash.   Neurological:      Mental Status: She is alert and oriented to person, place, and time.   Psychiatric:         Behavior: Behavior normal.         Thought Content: Thought content normal.               Assessment and Plan:  51 y.o. female with hemorrhoidectomy.  She is doing well.  No issues with his bowel movements.  More issues with swallowing.      No intervention at this time.  We spent 25 minutes with her in regards to the esophageal issues.  She needs to follow up with her prior GI surgeons vs PCP.  I did not wish to add to this complex issue with my limited knowledge.

## 2021-07-08 NOTE — Telephone Encounter
Pt left a voicemail this morning stating she was supposed to call Dr. Jonna Clark office in a week if she was not feeling any better.  She would like a call back.

## 2021-07-12 ENCOUNTER — Encounter: Admit: 2021-07-12 | Discharge: 2021-07-12 | Payer: PRIVATE HEALTH INSURANCE

## 2021-07-12 DIAGNOSIS — R0602 Shortness of breath: Secondary | ICD-10-CM

## 2021-07-12 DIAGNOSIS — R5383 Other fatigue: Secondary | ICD-10-CM

## 2021-07-12 MED ORDER — ASPIRIN 325 MG PO TAB
325 mg | Freq: Once | ORAL | 0 refills
Start: 2021-07-12 — End: ?

## 2021-07-15 ENCOUNTER — Encounter: Admit: 2021-07-15 | Discharge: 2021-07-15 | Payer: PRIVATE HEALTH INSURANCE

## 2021-07-15 MED ADMIN — SODIUM CHLORIDE 0.9 % IV SOLP [27838]: 500 mL | INTRAVENOUS | @ 14:00:00 | Stop: 2021-07-15 | NDC 00338004904

## 2021-07-15 MED ADMIN — ACETAMINOPHEN 325 MG PO TAB [101]: 650 mg | ORAL | @ 19:00:00 | Stop: 2021-07-16 | NDC 00904677361

## 2021-07-15 MED ADMIN — SODIUM CHLORIDE 0.9 % IV SOLP [27838]: 1000 mL | INTRAVENOUS | @ 14:00:00 | Stop: 2021-07-16 | NDC 00338004904

## 2021-07-17 ENCOUNTER — Encounter: Admit: 2021-07-17 | Discharge: 2021-07-17 | Payer: PRIVATE HEALTH INSURANCE

## 2021-07-17 NOTE — Telephone Encounter
Pt called reporting she had an echo in [place] on 12/22 and asks if another echo is needed prior to OV with Dr. Gaylene Brooks.

## 2021-07-19 ENCOUNTER — Encounter: Admit: 2021-07-19 | Discharge: 2021-07-19 | Payer: PRIVATE HEALTH INSURANCE

## 2021-07-19 ENCOUNTER — Ambulatory Visit: Admit: 2021-07-19 | Discharge: 2021-07-19 | Payer: MEDICAID

## 2021-07-19 ENCOUNTER — Ambulatory Visit: Admit: 2021-07-19 | Discharge: 2021-07-19 | Payer: PRIVATE HEALTH INSURANCE

## 2021-07-19 DIAGNOSIS — I5030 Unspecified diastolic (congestive) heart failure: Secondary | ICD-10-CM

## 2021-07-19 DIAGNOSIS — R5383 Other fatigue: Secondary | ICD-10-CM

## 2021-07-19 DIAGNOSIS — R0602 Shortness of breath: Secondary | ICD-10-CM

## 2021-07-19 DIAGNOSIS — M549 Dorsalgia, unspecified: Secondary | ICD-10-CM

## 2021-07-19 DIAGNOSIS — K289 Gastrojejunal ulcer, unspecified as acute or chronic, without hemorrhage or perforation: Secondary | ICD-10-CM

## 2021-07-19 DIAGNOSIS — J449 Chronic obstructive pulmonary disease, unspecified: Secondary | ICD-10-CM

## 2021-07-19 DIAGNOSIS — K319 Disease of stomach and duodenum, unspecified: Secondary | ICD-10-CM

## 2021-07-19 DIAGNOSIS — J45909 Unspecified asthma, uncomplicated: Secondary | ICD-10-CM

## 2021-07-19 LAB — CBC
HEMATOCRIT: 40 % (ref 36–45)
HEMOGLOBIN: 13 g/dL (ref 12.0–15.0)
MPV: 7.7 FL (ref 7–11)
RBC COUNT: 4.4 M/UL (ref 4.0–5.0)
WBC COUNT: 7.3 K/UL (ref 4.5–11.0)

## 2021-07-19 LAB — IRON + BINDING CAPACITY + %SAT+ FERRITIN
% SATURATION: 20 % — ABNORMAL LOW (ref 28–42)
FERRITIN: 86 ng/mL (ref 10–200)
IRON BINDING: 399 ug/dL — ABNORMAL HIGH (ref >60)
IRON: 81 ug/dL (ref 50–160)

## 2021-07-19 LAB — BASIC METABOLIC PANEL
CHLORIDE: 106 MMOL/L (ref 98–110)
CO2: 28 MMOL/L (ref 21–30)
POTASSIUM: 4.2 MMOL/L (ref 3.5–5.1)
SODIUM: 144 MMOL/L (ref 137–147)

## 2021-07-19 LAB — BNP (B-TYPE NATRIURETIC PEPTI): BNP: 87 pg/mL (ref 0–100)

## 2021-07-19 LAB — THYROID STIMULATING HORMONE-TSH: TSH: 0.6 uU/mL (ref 0.35–5.00)

## 2021-07-22 ENCOUNTER — Encounter: Admit: 2021-07-22 | Discharge: 2021-07-22 | Payer: PRIVATE HEALTH INSURANCE

## 2021-08-01 ENCOUNTER — Encounter: Admit: 2021-08-01 | Discharge: 2021-08-01 | Payer: PRIVATE HEALTH INSURANCE

## 2021-08-06 ENCOUNTER — Encounter: Admit: 2021-08-06 | Discharge: 2021-08-06 | Payer: PRIVATE HEALTH INSURANCE

## 2021-08-06 NOTE — Research Notes
Informed consent was obtained subsequent to receipt of the letter of approval from the [employer] and [employer]. Informed consent was obtained prior to performance of any procedures as outlined in the study protocol.   A thorough screening of the participant's chart was completed to identify inclusion criteria per the study protocol and the participant met the criteria to the best of my knowledge. No exclusions to the study protocol were found and the participant met all the study enrollment requirements to the best of my knowledge.   The details of the protocol were discussed with the participant by the PIs and the study coordinator. The study purpose, procedures, risk/benefits and alternatives were discussed with the participant. The participant voiced understanding that participation is voluntary and that he/she could withdraw at any time. Time was given for the participant to read the consent form and ask questions. The participant's questions were answered to his/her satisfaction and the participant signed the consent form. The participant verbalized full understanding of all the details of the consent. A copy of the signed consent form was placed in the chart and a copy of the signed consent form was given to the participant for future reference and contacts regarding participation.  The consent form discussed above was approved by the [place] and [facility].  The participant had her questions answered and voiced her understanding.     This was signed by [employer].               Study:  Alt-flow  (implant of atrial shunt device)  Sponsor: [employer]  PI: Dr. Candi Leash  Coordinator: Leota Sauers RN, MS  HSC#: [id number]  Protocol: 2018-02  ICF signed: 08/06/2021

## 2021-08-13 ENCOUNTER — Encounter: Admit: 2021-08-13 | Discharge: 2021-08-13 | Payer: PRIVATE HEALTH INSURANCE

## 2021-08-16 ENCOUNTER — Encounter: Admit: 2021-08-16 | Discharge: 2021-08-16 | Payer: PRIVATE HEALTH INSURANCE

## 2021-08-16 ENCOUNTER — Ambulatory Visit: Admit: 2021-08-16 | Discharge: 2021-08-16 | Payer: PRIVATE HEALTH INSURANCE

## 2021-08-16 DIAGNOSIS — I5030 Unspecified diastolic (congestive) heart failure: Secondary | ICD-10-CM

## 2021-08-16 MED ORDER — PERFLUTREN LIPID MICROSPHERES 1.1 MG/ML IV SUSP
1-10 mL | Freq: Once | INTRAVENOUS | 0 refills | Status: CP | PRN
Start: 2021-08-16 — End: ?

## 2021-08-22 ENCOUNTER — Encounter: Admit: 2021-08-22 | Discharge: 2021-08-22 | Payer: PRIVATE HEALTH INSURANCE

## 2021-09-04 ENCOUNTER — Encounter: Admit: 2021-09-04 | Discharge: 2021-09-04 | Payer: PRIVATE HEALTH INSURANCE

## 2021-09-06 ENCOUNTER — Ambulatory Visit: Admit: 2021-09-06 | Discharge: 2021-09-06 | Payer: MEDICAID

## 2021-09-06 ENCOUNTER — Encounter: Admit: 2021-09-06 | Discharge: 2021-09-06 | Payer: PRIVATE HEALTH INSURANCE

## 2021-09-06 DIAGNOSIS — I5030 Unspecified diastolic (congestive) heart failure: Secondary | ICD-10-CM

## 2021-09-06 MED ORDER — GADOBENATE DIMEGLUMINE 529 MG/ML (0.1MMOL/0.2ML) IV SOLN
32 mL | Freq: Once | INTRAVENOUS | 0 refills | Status: CP
Start: 2021-09-06 — End: ?
  Administered 2021-09-06: 21:00:00 32 mL via INTRAVENOUS

## 2021-09-12 ENCOUNTER — Encounter: Admit: 2021-09-12 | Discharge: 2021-09-12 | Payer: PRIVATE HEALTH INSURANCE

## 2021-09-12 ENCOUNTER — Ambulatory Visit: Admit: 2021-09-12 | Discharge: 2021-09-13 | Payer: MEDICAID

## 2021-09-12 DIAGNOSIS — E611 Iron deficiency: Secondary | ICD-10-CM

## 2021-09-12 DIAGNOSIS — K289 Gastrojejunal ulcer, unspecified as acute or chronic, without hemorrhage or perforation: Secondary | ICD-10-CM

## 2021-09-12 DIAGNOSIS — M549 Dorsalgia, unspecified: Secondary | ICD-10-CM

## 2021-09-12 DIAGNOSIS — K319 Disease of stomach and duodenum, unspecified: Secondary | ICD-10-CM

## 2021-09-12 DIAGNOSIS — I517 Cardiomegaly: Secondary | ICD-10-CM

## 2021-09-12 DIAGNOSIS — J449 Chronic obstructive pulmonary disease, unspecified: Secondary | ICD-10-CM

## 2021-09-12 DIAGNOSIS — J45909 Unspecified asthma, uncomplicated: Secondary | ICD-10-CM

## 2021-09-12 MED ORDER — DAPAGLIFLOZIN 10 MG PO TAB
10 mg | ORAL_TABLET | Freq: Every day | ORAL | 3 refills | Status: AC
Start: 2021-09-12 — End: ?
  Filled 2021-09-18: qty 30, 30d supply, fill #1

## 2021-09-12 MED ORDER — SPIRONOLACTONE 25 MG PO TAB
25 mg | ORAL_TABLET | Freq: Every day | ORAL | 1 refills | 90.00000 days | Status: AC
Start: 2021-09-12 — End: ?

## 2021-09-13 ENCOUNTER — Encounter: Admit: 2021-09-13 | Discharge: 2021-09-13 | Payer: PRIVATE HEALTH INSURANCE

## 2021-09-13 DIAGNOSIS — I5030 Unspecified diastolic (congestive) heart failure: Secondary | ICD-10-CM

## 2021-09-13 DIAGNOSIS — D508 Other iron deficiency anemias: Secondary | ICD-10-CM

## 2021-09-13 MED ORDER — IRON SUCROSE 300 MG IVPB
300 mg | Freq: Once | INTRAVENOUS | 0 refills
Start: 2021-09-13 — End: ?

## 2021-09-13 NOTE — Progress Notes
The Prior Authorization for Katelyn Wu for Katelyn Wu has been submitted. Will continue to follow.     [place] Patient Advocate   [phone]

## 2021-09-16 ENCOUNTER — Encounter: Admit: 2021-09-16 | Discharge: 2021-09-16 | Payer: PRIVATE HEALTH INSURANCE

## 2021-09-17 ENCOUNTER — Encounter: Admit: 2021-09-17 | Discharge: 2021-09-17 | Payer: PRIVATE HEALTH INSURANCE

## 2021-09-17 NOTE — Progress Notes
The Prior Authorization for Farxiga '10mg'$   was approved for Katelyn Wu from 09/13/21 to 09/14/22.  The copay is $0.00.         [place] Patient Advocate  [phone]

## 2021-09-18 ENCOUNTER — Encounter: Admit: 2021-09-18 | Discharge: 2021-09-18 | Payer: PRIVATE HEALTH INSURANCE

## 2021-09-18 MED ORDER — EPLERENONE 25 MG PO TAB
25 mg | ORAL_TABLET | Freq: Every day | ORAL | 1 refills | 90.00000 days | Status: AC
Start: 2021-09-18 — End: ?

## 2021-09-19 ENCOUNTER — Encounter: Admit: 2021-09-19 | Discharge: 2021-09-19 | Payer: PRIVATE HEALTH INSURANCE

## 2021-09-24 ENCOUNTER — Encounter: Admit: 2021-09-24 | Discharge: 2021-09-24 | Payer: PRIVATE HEALTH INSURANCE

## 2021-09-25 ENCOUNTER — Encounter: Admit: 2021-09-25 | Discharge: 2021-09-25 | Payer: PRIVATE HEALTH INSURANCE

## 2021-09-25 NOTE — Telephone Encounter
Called patient to schedule iron infusions.  Explained infusion process and expectations. Patient is agreeable to come for first infusion on 6/5 at 0800.  Instructed patient to check in at heart center admitting desk.  Patient given HF infusion clinic phone number for any questions or concerns. Patient verbalized understanding.

## 2021-09-25 NOTE — Telephone Encounter
Patient called to ask if she could receive her iron infusion in [place] with her PCP. She said it is a far drive to come here and wants to cancel her iron infusion with [place] for now and see if she can get the infusions in [place]. I told her I will send a message to our SW and see if that is an option and they will follow up with her. Patient appreciative of phone call.

## 2021-09-25 NOTE — Telephone Encounter
Attempted to reach patient to schedule IV Iron infusions as ordered by Dr. Manuella Ghazi. No answer, LVMOM requesting call back. HF Infusion Clinic phone number provided.

## 2021-09-26 ENCOUNTER — Encounter: Admit: 2021-09-26 | Discharge: 2021-09-26 | Payer: PRIVATE HEALTH INSURANCE

## 2021-09-26 NOTE — Telephone Encounter
Katelyn Wu called the HF infusion clinic to question whether Iron infusions, per patient her Iron levels were within limits at her PCP office. Katelyn Wu directed to contact the Dr. Trena Platt office as he was the provider that ordered the infusions. Pt verbalizes understanding and states she will call HF infusion clinic back if she wishes to schedule.

## 2021-09-27 ENCOUNTER — Encounter: Admit: 2021-09-27 | Discharge: 2021-09-27 | Payer: PRIVATE HEALTH INSURANCE

## 2021-09-27 NOTE — Telephone Encounter
-----   Message from Leane Platt, MD sent at 09/26/2021  2:28 PM CDT -----  Regarding: RE: medication  Contact: [phone]  Lets decrease to 49mg   ----- Message -----  From: CGale Journey RN  Sent: 09/24/2021   3:40 PM CDT  To: HLeane Platt MD  Subject: FMelton Alar medication                                   LOV 5/18 Treatment Plan     1.  Recommendations  -Start Aldactone '25mg'$  daily   -Start Farxiga '10mg'$  daily   -Iron Infusion   -Check BMP in one week     Pt reports being short of breath and having itching with FIran Any additional recommendations? Thanks, Katie C        Current Outpatient Medications:     atorvastatin (LIPITOR) 20 mg tablet, Take one tablet by mouth daily., Disp: 90 tablet, Rfl: 1    CHOLEcalciferoL (vitamin D3) (OPTIMAL D3) 50,000 units capsule, Take one capsule by mouth every 7 days., Disp: , Rfl:     dapagliflozin (FARXIGA) 10 mg tablet, Take one tablet by mouth daily., Disp: 90 tablet, Rfl: 3    eplerenone (INSPRA) 25 mg tablet, Take one tablet by mouth daily., Disp: 90 tablet, Rfl: 1    fluticasone propion/salmeterol (ADVAIR DISKUS IN), Inhale  by mouth into the lungs twice daily. 500/50, Disp: , Rfl:     nitroglycerin (NITROSTAT) 0.4 mg tablet, Place one tablet under tongue every 5 minutes as needed for Chest Pain. Max of 3 tablets, call 911., Disp: 25 tablet, Rfl: 3    YUVAFEM 10 mcg vaginal tablet, Insert or Apply  to vaginal area twice weekly., Disp: , Rfl:     ----- Message -----  From: CBaldwin Crown RN  Sent: 09/24/2021   8:35 AM CDT  To: Cvm Nurse Hf Team [place]  Subject: FW: medication                                     ----- Message -----  From: BIvory Broad Sent: 09/24/2021   8:10 AM CDT  To: Cvm Nurse Triage [place]  Subject: medication                                       Good morning the eplerenone medication is ok the farxiga I had an alergic reaction to it increased breathlessness and bad itching.

## 2021-09-27 NOTE — Telephone Encounter
LMV for pt notifying him to decrease farxiga to '5mg'$  daily (half tab) and req pt to verify receipt of msg.

## 2021-09-30 ENCOUNTER — Encounter: Admit: 2021-09-30 | Discharge: 2021-09-30 | Payer: PRIVATE HEALTH INSURANCE

## 2021-10-02 ENCOUNTER — Encounter: Admit: 2021-10-02 | Discharge: 2021-10-02 | Payer: PRIVATE HEALTH INSURANCE

## 2021-10-06 ENCOUNTER — Encounter: Admit: 2021-10-06 | Discharge: 2021-10-06 | Payer: PRIVATE HEALTH INSURANCE

## 2021-10-18 ENCOUNTER — Encounter: Admit: 2021-10-18 | Discharge: 2021-10-18 | Payer: PRIVATE HEALTH INSURANCE

## 2021-10-19 ENCOUNTER — Encounter: Admit: 2021-10-19 | Discharge: 2021-10-19 | Payer: PRIVATE HEALTH INSURANCE

## 2021-10-22 ENCOUNTER — Encounter: Admit: 2021-10-22 | Discharge: 2021-10-22 | Payer: PRIVATE HEALTH INSURANCE

## 2021-10-22 NOTE — Progress Notes
Patient wanted to reschedule visit to a later date this week.

## 2021-10-28 ENCOUNTER — Ambulatory Visit: Admit: 2021-10-28 | Discharge: 2021-10-29 | Payer: MEDICAID

## 2021-10-28 ENCOUNTER — Encounter: Admit: 2021-10-28 | Discharge: 2021-10-28 | Payer: MEDICAID

## 2021-10-28 DIAGNOSIS — I5189 Other ill-defined heart diseases: Secondary | ICD-10-CM

## 2021-10-28 DIAGNOSIS — J45909 Unspecified asthma, uncomplicated: Secondary | ICD-10-CM

## 2021-10-28 DIAGNOSIS — M549 Dorsalgia, unspecified: Secondary | ICD-10-CM

## 2021-10-28 DIAGNOSIS — J449 Chronic obstructive pulmonary disease, unspecified: Secondary | ICD-10-CM

## 2021-10-28 DIAGNOSIS — K319 Disease of stomach and duodenum, unspecified: Secondary | ICD-10-CM

## 2021-10-28 DIAGNOSIS — K289 Gastrojejunal ulcer, unspecified as acute or chronic, without hemorrhage or perforation: Secondary | ICD-10-CM

## 2021-10-28 MED ORDER — DAPAGLIFLOZIN PROPANEDIOL 5 MG PO TAB
5 mg | ORAL_TABLET | Freq: Every day | ORAL | 1 refills | Status: AC
Start: 2021-10-28 — End: ?

## 2021-10-28 NOTE — Progress Notes
Telehealth Visit Note        Date of Service: 10/28/2021    Katelyn Wu is a 51 y.o. female.      HPI     51 years old female with past medical history of dyspnea on exertion, heart failure with preserved ejection fraction who established care with Dr Steele Sizer on 09/12/21 via telehealth visit.  She has been seen by Dr. Bonney Roussel.  [place] and underwent coronary angiogram and RHC with exercise and was started on Lasix and metoprolol XL.  Her filling pressures at rest were normal and with exercise the PAD went up to 25 mmHg. She was referred back for diuresis given elevated filling pressures.  She had a sleep study done in December that was apparently negative for OSA. Amyloid workup negative.   ?  She was last seen by Dr. Steele Sizer on 09/12/21 at which time cardiac MRI ordered. She was started on aldactone and farxiga. Iron infusions ordered. Will be screened for Alt-Flo and if not a candidate should be considered for AIM Higher    Pt was not seen today. Got off telehealth visit before I was able to talk with her 0850         There were no vitals filed for this visit.  There is no height or weight on file to calculate BMI.   Wt Readings from Last 3 Encounters:   09/12/21 71.2 kg (157 lb)   08/16/21 81.7 kg (180 lb 3.2 oz)   07/19/21 79.8 kg (176 lb)        Past Medical History  Patient Active Problem List    Diagnosis Date Noted   ? Iron (Fe) deficiency anemia 09/13/2021   ? Shortness of breath 07/15/2021   ? Diastolic dysfunction 07/15/2021   ? Anxiety 04/23/2021   ? Chronic back pain 04/23/2021   ? Asthma 04/23/2021   ? Gastro-esophageal reflux disease without esophagitis 04/23/2021   ? Pelvic pain 06/07/2018     Pelvic exam 06/07/18 ~ left posterior vaginal tenderness with exam     ? Kidney stone on left side 06/07/2018     Noted on outside CT scan without contrast 01/01/18     ? Incomplete bladder emptying 06/07/2018     06/07/18   1st PVR =  2nd PVR = 56mL     ? Pelvic floor dysfunction in female 03/24/2018 ? Chronic constipation 03/24/2018   ? Other hemorrhoids 03/24/2018   ? Tubular adenoma polyp of rectum 03/24/2018   ? Rectocele 03/24/2018   ? 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 Abdominal distention 03/24/2018   ? Rectal pain 03/24/2018   ? BRBPR (bright red blood per rectum) 03/24/2018   ? Urinary frequency 03/17/2018     Urology Consult 06/07/18  Reports increased urinary frequency and nocturia 09/2017  History of IC outside Urology > 10 years ago ~ no records for review  Taking oxybutynin 5mg  BID? XL ?    PVR 122cc, 2nd PVR 56cc     ? 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  Skin: pink, no jaundice, exposed skin is intact  Eyes: conjunctivae and lids normal, pupils are equal and round   Lips: no pallor or cyanosis   Neck Veins:    Without obvious JVD  Respiratory  Effort: breathing comfortably, no respiratory distress   Lower extremities: no obvious swelling  Orientation: oriented to time, place and person   PSYCH: Appropriate   Language and Memory: patient responsive and seems to comprehend information   NEURO: Alert and conversant        Cardiovascular Studies  Echo   1. LV size is normal and overall systolic function also appears to be within normal limits. ?EF is about 55%.  2. Normal diastolic function   3. Normal right ventricular size and systolic function  4. Normal biatrial size  5. Normal central venous pressure  6. 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 ?There is a very eccentric posteriorly directed jet of likely moderate mitral regurgitation. ?No stenosis  7. Other valves without structural or functional abnormalities  8. Unable to assess pulmonary artery systolic pressure  9. No pericardial effusion    Cardiac MR  1. ?Normal left ventricular cavity size, normal systolic function,   calculated ejection fraction 54%. No regional wall motion abnormalities   were noted   2. ?Normal right ventricular cavity size, normal systolic function.   3. ?Normal sized biatrium   4. ?There is a focal area of transmural delayed gadolinium enhancement,   which is visualized in the mid to distal inferior wall which is visualized   in the short axis images, however it was not appreciated in the   four-chamber/two chamber views.   5. ?The region in question in other views appears extracardiac. Parametric   data sampling this region was within normal limits, lowering suspicion for   infiltrative/inflammatory processes.   6. ?Normal extracellular volume [21%].     No prior studies for comparison.       Problems Addressed Today  No diagnosis found.    Assessment and Plan       Lab Results   Component Value Date/Time    CHOL 215 10/18/2020 12:00 AM    TRIG 65 10/18/2020 12:00 AM    HDL 70 10/18/2020 12:00 AM    LDL 132 10/18/2020 12:00 AM    VLDL 13 10/18/2020 12:00 AM    CHOLHDLC 3 10/18/2020 12:00 AM               Current Medications (including today's revisions)  ? atorvastatin (LIPITOR) 20 mg tablet Take one tablet by mouth daily.   ? CHOLEcalciferoL (vitamin D3) (OPTIMAL D3) 50,000 units capsule Take one capsule by mouth every 7 days.   ? dapagliflozin (FARXIGA) 10 mg tablet Take one tablet by mouth daily.   ? eplerenone (INSPRA) 25 mg tablet Take one tablet by mouth daily.   ? fluticasone propion/salmeterol (ADVAIR DISKUS IN) Inhale  by mouth into the lungs twice daily. 500/50   ? nitroglycerin (NITROSTAT) 0.4 mg tablet Place one tablet under tongue every 5 minutes as needed for Chest Pain. Max of 3 tablets, call 911.   ? YUVAFEM 10 mcg vaginal tablet Insert or Apply  to vaginal area twice weekly.

## 2021-10-30 NOTE — Progress Notes
Date of Service: 10/31/2021    Katelyn Wu is a 51 y.o. female.   She is followed by Dr. Steele Sizer.  HPI     Her past medical history significant for heart failure with preserved ejection fraction, mitral regurgitation iron deficiency anemia, gastric reflux, and anxiety.    She is being considered for the Alt Flo trial, it will not start until late July or early August.    She was seen by Fara Boros 10/28/2021 via telehealth.    She has reported having itching, fatigue, dizziness, headache, cough, stomach issues.  Wondering if it is related to medication (atorvastatin/Farxiga).  Dr. Sherryll Burger recommended she stop both medications and we will follow-up in clinic.    She presents to heart failure clinic today via telehealth.  She reports she has stopped the atorvastatin/Farxiga and has not had any more itching.  She states she has stopped these medications separately in the past and believe both are causing her itching.  We will take these medications off of her medication list and add them to her allergies.  We discussed eplerenone, she states that it makes her feel wiped out/like crap.  She has been taking the medication at night, we discussed potential of taking it during the day however she states she has taken it during the day and has the same feelings as well.  She states she is also try to cut this medication in half, or has taken double sometimes with similar results.  We discussed potential of stopping this medication however she stated that it does seem to help with her blood pressure and if she does not take it her blood pressure will be in the 160s mmHg systolically.  We discussed other medications she is tried for blood pressure, she reports also being intolerant to lisinopril/losartan/Norvasc.  She continues to have shortness of breath with exertion, baseline.  No abdominal bloating or lower extremity edema. She also denies orthopnea, paroxysmal nocturnal dyspnea, early satiety, chest pain, irregular heartbeat/palpitations, dizziness, and/or syncope/near syncope.         Vitals:    10/31/21 0825   O2 Device: None (Room air)     There is no height or weight on file to calculate BMI.     Past Medical History  Patient Active Problem List    Diagnosis Date Noted   ? Iron (Fe) deficiency anemia 09/13/2021   ? Shortness of breath 07/15/2021   ? Diastolic dysfunction 07/15/2021   ? Anxiety 04/23/2021   ? Chronic back pain 04/23/2021   ? Asthma 04/23/2021   ? Gastro-esophageal reflux disease without esophagitis 04/23/2021   ? Pelvic pain 06/07/2018     Pelvic exam 06/07/18 ~ left posterior vaginal tenderness with exam     ? Kidney stone on left side 06/07/2018     Noted on outside CT scan without contrast 01/01/18     ? Incomplete bladder emptying 06/07/2018     06/07/18   1st PVR =  2nd PVR = 56mL     ? Pelvic floor dysfunction in female 03/24/2018   ? Chronic constipation 03/24/2018   ? Other hemorrhoids 03/24/2018   ? Tubular adenoma polyp of rectum 03/24/2018   ? Rectocele 03/24/2018   ? 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 Abdominal distention 03/24/2018   ? Rectal pain 03/24/2018   ? BRBPR (bright red blood per rectum) 03/24/2018   ? Urinary frequency 03/17/2018     Urology Consult 06/07/18  Reports increased urinary frequency  and nocturia 09/2017  History of IC outside Urology > 10 years ago ~ no records for review  Taking oxybutynin 5mg  BID? XL ?    PVR 122cc, 2nd PVR 56cc     ? 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  Skin: pink, no jaundice, exposed skin is intact  Eyes: conjunctivae and lids normal, pupils are equal and round   Lips: no pallor or cyanosis   Neck Veins: VESSELS:JVP difficult to appreciate    Respiratory Effort: breathing comfortably, no respiratory distress   Orientation: oriented to time, place and person   PSYCH: Appropriate   Language and Memory: patient responsive and seems to comprehend information   NEURO: Alert and conversant    Cardiovascular Studies  08/16/2021 Echo   1. LV size is normal and overall systolic function also appears to be within normal limits. ?EF is about 55%.  2. Normal diastolic function   3. Normal right ventricular size and systolic function  4. Normal biatrial size  5. Normal central venous pressure  6. 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 ?There is a very eccentric posteriorly directed jet of likely moderate mitral regurgitation. ?No stenosis  7. Other valves without structural or functional abnormalities  8. Unable to assess pulmonary artery systolic pressure  9. No pericardial effusion  ?  09/06/2021 cardiac MRI  1. ?Normal left ventricular cavity size, normal systolic function, calculated ejection fraction 54%. No regional wall motion abnormalities   were noted   2. ?Normal right ventricular cavity size, normal systolic function.   3. ?Normal sized biatrium   4. ?There is a focal area of transmural delayed gadolinium enhancement, which is visualized in the mid to distal inferior wall which is visualized in the short axis images, however it was not appreciated in the four-chamber/two chamber views.   5. ?The region in question in other views appears extracardiac. Parametric data sampling this region was within normal limits, lowering suspicion for infiltrative/inflammatory processes.   6. ?Normal extracellular volume [21%].     No prior studies for comparison.     07/15/2021 coronary angiogram  BASELINE HEMODYNAMICS:    1. Blood pressure 138/84, mean 105 mmHg.  2. Heart rate 66 beats per minute.  3. Body surface area 1.9 sq m.  4. Hemoglobin percentage 13.4 g/dL.  SATURATIONS:    1. Aorta saturation 96%.  2. RA saturation 63%.  3. PA saturation 65%.  PRESSURES:    1. Right atrial pressure is 5 mmHg.  2. Right ventricular pressure 31/1, CVP 7 mmHg.  3. PA pressure 31/11, mean 18 mmHg.  4. Pulmonary capillary wedge pressure 11 mmHg.  5. Transpulmonic gradient 7 mmHg.  6. Cardiac output by thermodilution technique was 4 L/minute with an index of 2.1 L/minute per sq m.  7. Cardiac output by Fick method was also 4 L/minute with an index of 2.1 L/minute per sq m.  FLUID BOLUS CHALLENGE:  500 mL was administered intraprocedurally followed by repeat hemodynamics which are as follows:  1. PA saturation 67%.  2. PA pressure 34/14, mean 21 mmHg.  3. Cardiac output by thermodilution technique was 4.6 L/minute with an index of 2.4 L/minute per sq m.  4. Cardiac output by Fick method was 3.1 L/minute with an index of 1.6 L/minute per sq m.   The patient allowed  to exercise for a total of 5 minutes on the table with arm raises and subsequently after 750 mL of fluid bolus, we repeated the hemodynamics, which are as follows.  1. PA saturation was 59%.  2. PA pressure was 53/25, mean 35 mmHg.  3. Cardiac output by thermodilution technique was 5 L/minute with an index of 2.6 L/minute per sq m.  4. Cardiac output by Fick method was 2.6 L/minute with an index of 1.4 L/minute per sq m, which is indicative of exercise-induced diastolic dysfunction.  LEFT HEART CATHETERIZATION:  Tiger catheter was used for LV pressure assessment, selective right and left native coronary angiography.  LEFT VENTRICULAR HEMODYNAMICS:    1. LV systolic blood pressure was 126 mmHg.  2. LVEDP was 10 mmHg.  There was no gradient across the aortic valve on catheter pullback.  ?DETAILS OF CORONARY ANGIOGRAM:    1. Left main:  The left main is a short vessel with no significant angiographic disease along its entire course.  This is a large caliber vessel, which bifurcates into a left anterior descending artery and a left circumflex artery.    2. Left anterior descending artery.  Large type 3 configuration.  Proximal to distal portion, there is minor plaquing, but no significant obstructive disease.  It gives rise to 2 small diagonal branches with minor plaquing, but no significant focal obstructive disease is noted.  3. Left circumflex artery.  In the proximal and distal portions, it is a large caliber vessel with minor plaquing.  It gives rise to 2 large obtuse marginal vessels, both of which have minor plaquing, but no significant obstructive disease is noted.  4. Right coronary artery:  The right coronary artery is a large dominant vessel.  Proximal and distal portions have minor plaquing.  It bifurcates at a high level to a PDA and a PLV.  Both of these are moderate caliber vessels with minor plaquing, but no significant focal obstructive stenosis is noted.  FINAL IMPRESSION:    1. Patent epicardial coronary arteries with normal TIMI-3 flow and no significant obstructive coronary artery disease.  2. Normal left ventricular end-diastolic pressure without aortic stenosis at baseline.  3. Normal filling pressures at baseline with significant worsening of pulmonary artery pressure to 35 mmHg with a reduction in pulmonary artery saturation indicative of exercise-induced diastolic dysfunction.    Cardiovascular Health Factors  Vitals BP Readings from Last 3 Encounters:   08/16/21 (!) 148/92   07/19/21 132/82   07/15/21 114/77     Wt Readings from Last 3 Encounters:   10/28/21 81.2 kg (179 lb)   09/12/21 71.2 kg (157 lb)   08/16/21 81.7 kg (180 lb 3.2 oz)     BMI Readings from Last 3 Encounters:   10/28/21 28.04 kg/m?   09/12/21 24.59 kg/m?   08/16/21 28.22 kg/m?      Smoking Social History     Tobacco Use   Smoking Status Never   Smokeless Tobacco Never   Vaping Use   ? Vaping Use: Never used      Lipid Profile Cholesterol   Date Value Ref Range Status   10/18/2020 215  Final     HDL   Date Value Ref Range Status   10/18/2020 70  Final     LDL   Date Value Ref Range Status   10/18/2020 132  Final     Triglycerides   Date Value Ref Range Status   10/18/2020 65  Final  Blood Sugar No results found for: HGBA1C  Glucose   Date Value Ref Range Status   07/19/2021 84 70 - 100 MG/DL Final   16/01/9603 88 70 - 100 MG/DL Final   54/12/8117 86  Final          Problems Addressed Today  Encounter Diagnoses   Name Primary?   ? Chronic heart failure with preserved ejection fraction (HCC) Yes   ? Nonischemic cardiomyopathy (HCC)    ? Mitral valve insufficiency, unspecified etiology    ? Essential hypertension    ? Mixed hyperlipidemia        Assessment and Plan     Chronic Heart Failure with Preserved Ejection Fraction:    -Echo on 07/2021 showed an EF of 55%  GDMT PTA Changes   SGLT-2 Inhibitor  Farxiga, stopped for itching    Aldosterone Antagonist  eplerenone 25 mg daily    Diuretic no    Anticoagulation for Afib/flutter NA    -Dr. Sherryll Burger is looking to see if she is a candidate for the Alt flow study.  -Today, she describes NYHA Functional Class III symptoms, appears euvolemic based on her report.  As above [place] was discontinued, despite trying to decrease dose she continued to have itching and itching resolved when she stopped taking it.  We have added Farxiga to her allergy list.  In regards to her Inspra known she does not feel after taking that.  After further conversation she has had other hypertensive medications that she also did not tolerate and thinks that she tolerates the Inspra better than his other medications.  We discussed strategies to potentially help her tolerate the Inspra and she is wanting to stay on it for now.  She is to contact us if her symptoms worsen.  -Reinforce 2 g sodium diet and 2 L fluid restriction.  -She will follow-up with heart failure nurse practitioner in 3 months.    Non-Ischemic Cardiomyopathy:    BNP Labs:   Lab Results   Component Value Date    BNP 87.0 07/19/2021   -06/2021 coronary angiogram without significant coronary disease.  -Cardiac amyloid work-up negative.  -GDMT as described above.    Mitral Regurgitation:  -07/2021 echo shows anterior leaflet of the mitral valve is somewhat thickened, likely moderate mitral regurgitation.    Hypertension:  -Has had issues with high blood pressure. Has not tolerated lisinopril, Norvasc, losartan and has not tolerated (states feels 'like crap' on all of them).  -As above she states without her Inspra her blood pressure goes up to the 160s mmHg systolically, we did not make any changes and we will continue to monitor.    Hyperlipidemia:  Last Lipid Profile Cholesterol   Date Value Ref Range Status   10/18/2020 215  Final     HDL   Date Value Ref Range Status   10/18/2020 70  Final     LDL   Date Value Ref Range Status   10/18/2020 132  Final     Triglycerides   Date Value Ref Range Status   10/18/2020 65  Final      -She reported itching with atorvastatin, we remove this from her medication list.  We discussed but potential of trying an alternative statin although as above she seems to be very sensitive to medications.  She was willing to try Zocor 10 mg daily.  She is to let us know if she does not tolerate.    Creatinine Screening:  Creatinine Creatinine  Date Value   07/19/2021 0.90 MG/DL   16/01/9603 5.40 MG/DL (H)   98/02/9146 8.29           Follow Up Visit: 3 months with heart failure nurse practitioner.    I have personally documented the HPI, exam and medical decision making.  Patient education: I reviewed recent lab results and current medications, medication instructions, discussed heart failure signs & symptoms,  low  sodium diet, fluid restriction and daily weights. I have instructed the patient on the plan of care and they verbalize understanding of the plan. Please see AVS for full patient teaching. Patient advised to call our office if s/he has any problems, questions, worsening symptoms, or concerns prior to the next appointment.       Total Time Today was 30 minutes in the following activities: Preparing to see the patient, Obtaining and/or reviewing separately obtained history, Performing a medically appropriate examination and/or evaluation, Counseling and educating the patient/family/caregiver, Ordering medications, tests, or procedures as appropriate, Referring and communication with other health care professionals (when not separately reported), Documenting clinical information in the electronic or other health record, and Care coordination (not separately reported).    Thank you for allowing me to participate in the care of this patient. If you have any questions please do not hesitate to contact our office.     This note was in part completed with Dragon, a speech recognition software.  Some grammatical and transcription errors may have occurred.  If you have any concern, please contact the the our office for clarification.       Justus Memory, DNP, APRN, NP-C  Collaborating Physician: Dr. Joylene Grapes Dr. Dickie La  [facility]  Phone: [phone] Fax: [phone]         Current Medications (including today's revisions)  ? CHOLEcalciferoL (vitamin D3) (OPTIMAL D3) 50,000 units capsule Take one capsule by mouth every 7 days.   ? eplerenone (INSPRA) 25 mg tablet Take one tablet by mouth daily.   ? fluticasone propion/salmeterol (ADVAIR DISKUS IN) Inhale  by mouth into the lungs twice daily. 500/50   ? nitroglycerin (NITROSTAT) 0.4 mg tablet Place one tablet under tongue every 5 minutes as needed for Chest Pain. Max of 3 tablets, call 911.   ? simvastatin (ZOCOR) 10 mg tablet Take one tablet by mouth at bedtime daily.   ? YUVAFEM 10 mcg vaginal tablet Insert or Apply  to vaginal area twice weekly.

## 2021-10-31 ENCOUNTER — Encounter: Admit: 2021-10-31 | Discharge: 2021-10-31 | Payer: MEDICAID

## 2021-10-31 ENCOUNTER — Ambulatory Visit: Admit: 2021-10-31 | Discharge: 2021-11-01 | Payer: MEDICAID

## 2021-10-31 DIAGNOSIS — E782 Mixed hyperlipidemia: Secondary | ICD-10-CM

## 2021-10-31 DIAGNOSIS — I340 Nonrheumatic mitral (valve) insufficiency: Secondary | ICD-10-CM

## 2021-10-31 DIAGNOSIS — I10 Essential (primary) hypertension: Secondary | ICD-10-CM

## 2021-10-31 DIAGNOSIS — M549 Dorsalgia, unspecified: Secondary | ICD-10-CM

## 2021-10-31 DIAGNOSIS — I5032 Chronic diastolic (congestive) heart failure: Secondary | ICD-10-CM

## 2021-10-31 DIAGNOSIS — K319 Disease of stomach and duodenum, unspecified: Secondary | ICD-10-CM

## 2021-10-31 DIAGNOSIS — K289 Gastrojejunal ulcer, unspecified as acute or chronic, without hemorrhage or perforation: Secondary | ICD-10-CM

## 2021-10-31 DIAGNOSIS — I428 Other cardiomyopathies: Secondary | ICD-10-CM

## 2021-10-31 DIAGNOSIS — J449 Chronic obstructive pulmonary disease, unspecified: Secondary | ICD-10-CM

## 2021-10-31 DIAGNOSIS — J45909 Unspecified asthma, uncomplicated: Secondary | ICD-10-CM

## 2021-10-31 MED ORDER — SIMVASTATIN 10 MG PO TAB
10 mg | ORAL_TABLET | Freq: Every evening | ORAL | 1 refills | Status: AC
Start: 2021-10-31 — End: ?

## 2021-11-02 ENCOUNTER — Encounter: Admit: 2021-11-02 | Discharge: 2021-11-02 | Payer: MEDICAID

## 2021-11-08 ENCOUNTER — Encounter: Admit: 2021-11-08 | Discharge: 2021-11-08 | Payer: MEDICAID

## 2021-11-11 ENCOUNTER — Encounter: Admit: 2021-11-11 | Discharge: 2021-11-11 | Payer: MEDICAID

## 2021-11-11 DIAGNOSIS — I5030 Unspecified diastolic (congestive) heart failure: Secondary | ICD-10-CM

## 2021-11-11 DIAGNOSIS — I517 Cardiomegaly: Secondary | ICD-10-CM

## 2021-11-11 DIAGNOSIS — E611 Iron deficiency: Secondary | ICD-10-CM

## 2021-11-11 LAB — BASIC METABOLIC PANEL
ANION GAP: 7 — ABNORMAL LOW (ref 8–16)
BLD UREA NITROGEN: 17
BUN/CREATININE RATIO: 18
CALCIUM: 9.7
CHLORIDE: 105
CO2: 27
CREATININE: 0.9
GFR ESTIMATED: 65
GLUCOSE,PANEL: 91
POTASSIUM: 4
SODIUM: 139

## 2021-11-13 ENCOUNTER — Encounter: Admit: 2021-11-13 | Discharge: 2021-11-13 | Payer: MEDICAID

## 2021-11-13 MED ORDER — ISOSORBIDE MONONITRATE 30 MG PO TB24
30 mg | ORAL_TABLET | Freq: Every morning | ORAL | 3 refills | 90.00000 days | Status: AC
Start: 2021-11-13 — End: ?

## 2021-11-13 NOTE — Telephone Encounter
-----   Message from Haywood Lasso, RN sent at 11/13/2021  7:54 AM CDT -----  Regarding: FW: Medicine  Contact: [phone]    ----- Message -----  From: Ivory Broad  Sent: 11/12/2021   5:17 PM CDT  To: Cvm Nurse Triage [place]  Subject: Medicine                                         I feel like I have angina I have chest heavyness bleaching so actually the blood pressure med probably will work if I can get that under control I had other symptoms and I took a nitro glycerin pill it helped but it's only a quick fix need something to get this calmed down.

## 2021-11-13 NOTE — Telephone Encounter
Called pt using 2 pt ID. She states she is not currently having any chest pain or symptoms and is at work. This staff advised that it would be best for her to be checked out in an ED. Pt states she is not going to go now but will if she has the symptoms again. She states that it has been happening in the evenings when she tries to relax. She has chest pain and heaviness, elevated blood pressures, shortness of breath and feeling of heart burn which was all relieved for a short period of time with nitro. This staff emphasized that those symptoms and the relief with nitro could very well be cardiac and absolutely should be investigated in an ED at this time. Pt states again that she is feeling much better but if it happens again she will go. This staff did state that Dr. Manuella Ghazi is offering imdur to help with the symptoms but that this does not replace going to the ED if she has symptoms again. She states she will pick up the medication and start it in the morning. Prescription sent to pharmacy as ordered.

## 2021-11-13 NOTE — Telephone Encounter
?  Patient Message    All Conversations: Medicine  (Newest Message First)  November 13, 2021  Katelyn Burly, MD  to Katelyn Ro, RN      ? 11/13/21 ?8:55 AM  If she is having chest pain, we can start Imdur 30mg  daily which will help with chest pain and also with angina   ?    SS  ? 11/13/21 ?7:54 AM  Katelyn Cal, RN routed this conversation to Cvm Nurse Hf Team [place]   November 12, 2021  Katelyn Wu  to [facility] Cvm Nurse Triage [place] (supporting Katelyn Burly, MD)    ? 11/12/21 ?5:17 PM  I feel like I have angina I have chest heavyness bleaching so actually the blood pressure med probably will work if I can get that under control I had other symptoms and I took a nitro glycerin pill it helped but it's only a quick fix need something to get this calmed down.   Katelyn Ro, RN  to Katelyn Burly, MD    KP  ? 11/12/21 ?5:05 PM  She is concerned that her BP's go up after taking eplerenone and not get lower. I'm not sure what to recommend for her?   ?    HD  ? 11/12/21 ?5:03 PM  Katelyn Crumble, RN routed this conversation to Cvm Nurse Hf Team [place]   Katelyn Wu  to P Cvm Nurse Triage [place] (supporting Katelyn Burly, MD)    ? 11/12/21 ?5:00 PM  128/61  that what it was this morning then when I took the epleone it went up to 138/81 and it goes higher when I sit still or when I barely move around and I really don't move around a lot my job does not require me to move around a lot that's why I'm concerned because my blood pressure is not going down  no matter what I do with this medicine.  Katelyn Ro, RN  to Katelyn Wu    KP  ? 11/12/21 ?4:08 PM  Do you know what your blood pressure were before taking eplerenone? Typically your blood pressure will lower after taking this medication.     Last read by Katelyn Wu at ?8:37 AM on 11/13/2021.  ?    HD  ? 11/12/21 ?2:25 PM  Katelyn Crumble, RN routed this conversation to Cvm Nurse Hf Team [place]   ?    HD  ? 11/12/21 ?2:23 PM  Katelyn Crumble, RN routed this conversation to Cvm Nurse Hf Team [place]   Katelyn Wu  to [facility] Cvm Nurse Triage [place] (supporting Katelyn Burly, MD)        ? 11/12/21 ?2:23 PM  I mean my blood pressure raises when I take the epleone.  Katelyn Wu  to P Cvm Nurse Triage [place] (supporting Katelyn Burly, MD)        ? 11/12/21 ?2:20 PM  Im wondering is increasing this blood pressure med a good ideal ? Since I already feel bad when I take it ready?  Katelyn Wu  to P Cvm Nurse Triage  (supporting Katelyn Burly, MD)        ? 11/12/21 ?2:16 PM  Yes  Katelyn Ro, RN     KP  ? 11/12/21 ?1:10 PM  Note  Addended by: Katelyn Wu on: 11/12/2021 01:10 PM    ? ?Modules accepted: Orders          ?  KP  ? 11/12/21 ?1:09 PM  Katelyn Ro, RN routed this conversation to Katelyn Burly, MD   Katelyn Ro, RN  to Katelyn Wu    KP    ? 11/12/21 ?1:09 PM  He also wants you to get lab work in 1 week to see how your kidneys tolerate the increased. Is the [facility] still your preferred lab?     Last read by Katelyn Wu at ?8:37 AM on 11/13/2021.  Katelyn Ro, RN     KP  ? 11/12/21 ?1:07 PM  Note ?            Katelyn Ro, RN  to Katelyn Wu    KP  ? 11/12/21 ?1:07 PM  I spoke with Dr. Sherryll Burger who recommends increasing your eplerenone to 50 mg daily. Hopefully this will help your blood pressure. I'm sorry you still continue to feel crummy, I hope the Alt-Flo team can contact you soon and get that process started for you.     Last read by Katelyn Wu at ?8:37 AM on 11/13/2021.  Katelyn Burly, MD  to Katelyn Ro, RN      ? 11/12/21 12:57 PM  We cna increase to 50mg  daily. Check BMP on one week   Katelyn Ro, RN  to Katelyn Burly, MD    KP  ? 11/12/21 12:42 PM  See message below. BP's are elevated and pt has c/o SOA and fatigue. Med list only had eplerenone 25 mg daily. Pt has not tolerated Farxiga, Lisinopril, losartan or amlodipine in the past. Do you have any recommendations? Pt should hear back from our research team regarding alt-flow study.   Katelyn Ro, RN  to Katelyn Pester Wu    KP  ? 11/12/21 12:39 PM  I will review this message with Dr. Sherryll Burger since your blood pressures are still elevated. I will reach back out with his recommendations.     Last read by Katelyn Wu at ?8:37 AM on 11/13/2021.  ?    ET  ? 11/12/21 12:37 PM  [name], RN routed this conversation to Cvm Nurse Hf Team [place]   Katelyn Wu  to P Cvm Nurse Triage [place] (supporting Katelyn Burly, MD)    ? 11/12/21 11:33 AM  Blood pressures 161/101. 148/101, 138/61 I think my condition is getting worse no matter what I do I an exhausted.  Katelyn Ro, RN  to Katelyn Wu    KP  ? 11/12/21 ?9:48 AM  What have your blood pressures been?     Last read by Katelyn Wu at ?2:14 PM on 11/12/2021.  ?    TT  ? 11/12/21 ?9:36 AM  Katelyn Chance, RN routed this conversation to Cvm Nurse Hf Team [place]   November 11, 2021  Katelyn Wu  to [facility] Cvm Nurse Triage [place] (supporting Katelyn Burly, MD)    ? 11/11/21 ?4:47 PM  Basically I have been really dizzy, short of breath really out of it had little chest pain I take the blood pressure medicine in the am it's really not changing my blood pressure at all no matter how much water I drink so I really don't know what the deal is because neither medicine really works but the cholesterol pill is at night so it is not so bad but not so good either.  Katelyn Cha, RN  to Katelyn Wu    TB  ? 11/11/21 ?2:34  PM  Katelyn Wu, can you give more detail on how you are feeling and any heart failure symptoms you may be having?  At your last visit you had some dizziness, headache, cough and stomach symptoms along with feeling wiped out and having some shortness of breath.  Have these symptoms worsened or changed?  What got you to the point of wanting to go to the ER?    ?  [name] RN    Last read by Katelyn Wu at ?2:14 PM on 11/12/2021.  ?    ET  ? 11/11/21 ?1:37 PM  [name], RN routed this conversation to Cvm Nurse Hf Team [place]   Katelyn Pester [place]  to P Cvm Nurse Triage [place] (supporting Katelyn Burly, MD)    ? 11/11/21 12:48 PM  Im glad my blood work is fine but I am not taking the blood pressure med I felt so bad this weekend I almost admitted myself into the hospital.  Additional Documentation           Orders Placed     BASIC METABOLIC PANEL  Approved Medication Requests       eplerenone 50 mg Oral DAILY   Medication List  Visit Diagnoses       Diastolic dysfunction  Problem List

## 2021-11-15 ENCOUNTER — Encounter: Admit: 2021-11-15 | Discharge: 2021-11-15 | Payer: MEDICAID

## 2021-11-20 ENCOUNTER — Encounter: Admit: 2021-11-20 | Discharge: 2021-11-20 | Payer: MEDICAID

## 2021-11-29 ENCOUNTER — Ambulatory Visit: Admit: 2021-11-29 | Discharge: 2021-11-30 | Payer: MEDICARE

## 2021-11-29 ENCOUNTER — Encounter: Admit: 2021-11-29 | Discharge: 2021-11-29 | Payer: MEDICARE

## 2021-11-29 DIAGNOSIS — J45909 Unspecified asthma, uncomplicated: Secondary | ICD-10-CM

## 2021-11-29 DIAGNOSIS — I5030 Unspecified diastolic (congestive) heart failure: Secondary | ICD-10-CM

## 2021-11-29 DIAGNOSIS — I340 Nonrheumatic mitral (valve) insufficiency: Secondary | ICD-10-CM

## 2021-11-29 DIAGNOSIS — K319 Disease of stomach and duodenum, unspecified: Secondary | ICD-10-CM

## 2021-11-29 DIAGNOSIS — M549 Dorsalgia, unspecified: Secondary | ICD-10-CM

## 2021-11-29 DIAGNOSIS — K289 Gastrojejunal ulcer, unspecified as acute or chronic, without hemorrhage or perforation: Secondary | ICD-10-CM

## 2021-11-29 DIAGNOSIS — R0602 Shortness of breath: Secondary | ICD-10-CM

## 2021-11-29 DIAGNOSIS — J449 Chronic obstructive pulmonary disease, unspecified: Secondary | ICD-10-CM

## 2021-11-29 MED ORDER — ENTRESTO 24-26 MG PO TAB
1 | ORAL_TABLET | Freq: Two times a day (BID) | ORAL | 1 refills | Status: AC
Start: 2021-11-29 — End: ?
  Filled 2021-11-30: qty 60, 30d supply, fill #1

## 2021-11-29 MED ORDER — ENTRESTO 24-26 MG PO TAB
1 | ORAL_TABLET | Freq: Two times a day (BID) | ORAL | 0 refills | Status: AC
Start: 2021-11-29 — End: ?

## 2021-11-29 NOTE — Progress Notes
Contacted Journie Howson Scifres to obtain updated insurance info in order to process the [place] received for her.  She was not at home and requested a call back. I told her I would set her up with the free 30 day trial card so we could get the med shipped out and would then call her Monday to get the ins info from her.    [place] [facility] [place], Specialty Pharmacy  [phone]

## 2021-12-02 ENCOUNTER — Encounter: Admit: 2021-12-02 | Discharge: 2021-12-02 | Payer: MEDICARE

## 2021-12-03 ENCOUNTER — Encounter: Admit: 2021-12-03 | Discharge: 2021-12-03 | Payer: MEDICARE

## 2021-12-04 ENCOUNTER — Encounter: Admit: 2021-12-04 | Discharge: 2021-12-04 | Payer: MEDICARE

## 2021-12-06 ENCOUNTER — Encounter: Admit: 2021-12-06 | Discharge: 2021-12-06 | Payer: MEDICARE

## 2021-12-06 MED ORDER — EPLERENONE 25 MG PO TAB
50 mg | ORAL_TABLET | Freq: Every day | ORAL | 3 refills | Status: CN
Start: 2021-12-06 — End: ?

## 2021-12-06 NOTE — Progress Notes
Left a voicemail for Katelyn Wu to obtain her updated prescription insurance info.    [place] Patient Katelyn Wu, Specialty Pharmacy  [phone]

## 2021-12-09 ENCOUNTER — Encounter: Admit: 2021-12-09 | Discharge: 2021-12-09 | Payer: MEDICARE

## 2021-12-09 DIAGNOSIS — I5189 Other ill-defined heart diseases: Secondary | ICD-10-CM

## 2021-12-09 LAB — URINALYSIS DIPSTICK
GLUCOSE,UA: NEGATIVE
NITRITE: NEGATIVE
PROTEIN,UA: NEGATIVE
URINE BILE: NEGATIVE
URINE KETONE: NEGATIVE
URINE PH: 6
URINE SPEC GRAVITY: 1
UROBILINOGEN: 0.2

## 2021-12-09 LAB — URINALYSIS, MICROSCOPIC

## 2021-12-09 NOTE — Telephone Encounter
CXR, lab requested from [facility].

## 2021-12-10 ENCOUNTER — Encounter: Admit: 2021-12-10 | Discharge: 2021-12-10 | Payer: MEDICARE

## 2021-12-12 ENCOUNTER — Encounter: Admit: 2021-12-12 | Discharge: 2021-12-12 | Payer: MEDICARE

## 2021-12-16 ENCOUNTER — Encounter: Admit: 2021-12-16 | Discharge: 2021-12-16 | Payer: MEDICARE

## 2021-12-17 ENCOUNTER — Encounter: Admit: 2021-12-17 | Discharge: 2021-12-17 | Payer: MEDICARE

## 2022-01-01 ENCOUNTER — Encounter: Admit: 2022-01-01 | Discharge: 2022-01-01 | Payer: MEDICARE

## 2022-01-03 ENCOUNTER — Encounter: Admit: 2022-01-03 | Discharge: 2022-01-03 | Payer: MEDICARE

## 2022-01-03 NOTE — Telephone Encounter
Received call from [facility] that PA is needed for echo prior to scheduling pt. Fax #[phone]

## 2022-01-03 NOTE — Telephone Encounter
Called insurance and obtained PA for Echocardiogram to be performed at [facility] in [place], [place].    Case # [id number]  Auth # [id number]  Dates 01/03/22 - 02/17/22    Notified facility

## 2022-01-03 NOTE — Telephone Encounter
Received call that echocardiogram was approved. Confirmation number: [id number]. Approval valid until Feb 17, 2022.

## 2022-01-21 ENCOUNTER — Encounter: Admit: 2022-01-21 | Discharge: 2022-01-21 | Payer: MEDICARE

## 2022-01-21 ENCOUNTER — Ambulatory Visit: Admit: 2022-01-21 | Discharge: 2022-01-21 | Payer: MEDICARE

## 2022-01-21 DIAGNOSIS — I10 Essential (primary) hypertension: Secondary | ICD-10-CM

## 2022-01-21 DIAGNOSIS — I38 Endocarditis, valve unspecified: Secondary | ICD-10-CM

## 2022-01-22 ENCOUNTER — Encounter: Admit: 2022-01-22 | Discharge: 2022-01-22 | Payer: MEDICARE

## 2022-01-23 ENCOUNTER — Encounter: Admit: 2022-01-23 | Discharge: 2022-01-23 | Payer: MEDICARE

## 2022-01-27 ENCOUNTER — Encounter: Admit: 2022-01-27 | Discharge: 2022-01-27 | Payer: MEDICARE

## 2022-01-27 DIAGNOSIS — I5189 Other ill-defined heart diseases: Secondary | ICD-10-CM

## 2022-01-31 ENCOUNTER — Encounter: Admit: 2022-01-31 | Discharge: 2022-01-31 | Payer: MEDICARE

## 2022-01-31 NOTE — Progress Notes
Report Sheet    TEE           Indication: Mitral regurgitation  Ordering Provider: Sherryll Burger    Name: Katelyn Wu     Age: 51 y.o.    DOB: 1971-01-01     MRN: [id number]  Patient phone number: [phone]    Communication Barriers: NA    Lab(s) needed: NA  Other: NA    Device check needed: No  Device: No results found for: GENERATOR, EPDEVTYP    Other implanted devices/pumps: No  Has Controller (pt to bring): No    Pt Called: Yes  Arrival Time: 9:30AM  Driver Information: Maralyn Sago - Friend    Date of Last H&P: 11/29/2021   Additional Information: Needs update    Prior TEE: NA     Prior DCCV: NA     Prior Echo:01/21/2022       Previous TEE/DCCV details: NA    EF:   ECHO EF   Date Value Ref Range Status   01/21/2022 55 % Final       Anticoag:NA     Dose:NA     Frequency:NA     Missed:NA  Labs   INR    HGB    Platelets    Glucose    NA    K    Creatinine    Other      Medications to HOLD day of:  ? Vitamins/Supplements    Probe   Gastric Surgery No   GERD No   Swallow difficulty No   Head/Neck Surg/Rad No   Chest Surg/Rad No   EGD Yes - no issues   Varices/Esophag CA No   GI bleed No   Dental issues      Sedation   COPD No   OSA/CPAP No   Asthma No   Pulm HTN No   Anesthesia Issues No   Smoker    ETOH & Frequency    Drug Use    Chronic Pain Med      Current Medications:   ? CHOLEcalciferoL (vitamin D3) (OPTIMAL D3) 50,000 units capsule Take one capsule by mouth every 7 days.   ? eplerenone (INSPRA) 25 mg tablet Take two tablets by mouth daily.   ? fluticasone propion/salmeterol (ADVAIR DISKUS IN) Inhale  by mouth into the lungs twice daily. 500/50   ? isosorbide mononitrate ER (IMDUR) 30 mg tablet,extended release 24 hr Take one-half tablet by mouth every morning.   ? nitroglycerin (NITROSTAT) 0.4 mg tablet Place one tablet under tongue every 5 minutes as needed for Chest Pain. Max of 3 tablets, call 911.   ? pravastatin (PRAVACHOL) 20 mg tablet Take one tablet by mouth at bedtime daily.   ? valsartan (DIOVAN) 40 mg tablet Take one tablet by mouth daily.   ? YUVAFEM 10 mcg vaginal tablet Insert or Apply  to vaginal area twice weekly.       Past Medical/Surgical History:   Patient Active Problem List    Diagnosis Date Noted   ? Iron (Fe) deficiency anemia 09/13/2021   ? Shortness of breath 07/15/2021   ? Diastolic dysfunction 07/15/2021   ? Anxiety 04/23/2021   ? Chronic back pain 04/23/2021   ? Asthma 04/23/2021   ? Gastro-esophageal reflux disease without esophagitis 04/23/2021   ? Pelvic pain 06/07/2018   ? Kidney stone on left side 06/07/2018   ? Incomplete bladder emptying 06/07/2018   ? Pelvic floor dysfunction in female 03/24/2018   ? Chronic constipation 03/24/2018   ?  Other hemorrhoids 03/24/2018   ? Tubular adenoma polyp of rectum 03/24/2018   ? Rectocele 03/24/2018   ? 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 Abdominal distention 03/24/2018   ? Rectal pain 03/24/2018   ? BRBPR (bright red blood per rectum) 03/24/2018   ? Urinary frequency 03/17/2018   ? Intussusception of rectum (HCC) 02/08/2018      Past Medical History:   Diagnosis Date   ? Asthma    ? Back pain    ? COPD mixed type (HCC)     and asthma   ? Stomach problems    ? Ulcer of the stomach and intestine       Surgical History:   Procedure Laterality Date   ? ANORECTAL MANOMETRY N/A 06/18/2017    Performed by Jolee Ewing, MD at [facility] ENDO   ? flex sig N/A 07/23/2017    Performed by Jolee Ewing, MD at [facility] OR   ? ROBOT VENTRAL MESH RECTOPEXY N/A 02/08/2018    Performed by Benetta Spar, MD at [facility] OR   ? REPAIR RECTOCELE  02/08/2018    Performed by Benetta Spar, MD at [facility] OR   ? EGD N/A 04/16/2018    Performed by Remigio Eisenmenger, MD at [facility] ENDO   ? ESOPHAGOGASTRODUODENOSCOPY WITH BIOPSY - FLEXIBLE  04/16/2018    Performed by Remigio Eisenmenger, MD at [facility] ENDO   ? HEMORRHOIDECTOMY - 2 OR MORE COLUMNS/ GROUPS - INTERNAL AND EXTERNAL N/A 12/12/2020    Performed by Costella Hatcher, DO at [facility] OR   ? ANGIOGRAPHY CORONARY ARTERY WITH RIGHT AND LEFT HEART CATHETERIZATION WITH POSSIBLE EXERCISE (ARM WEIGHTS) N/A 07/15/2021    Performed by Julienne Kass, MD at [facility] CATH LAB   ? POSSIBLE PERCUTANEOUS CORONARY STENT PLACEMENT WITH ANGIOPLASTY N/A 07/15/2021    Performed by Julienne Kass, MD at [facility] CATH LAB   ? HX HYSTERECTOMY     ? HYSTERECTOMY      2009   ? TUBAL LIGATION         Allergies:   Allergies   Allergen Reactions   ? Atorvastatin ITCHING   ? Farxiga [Dapagliflozin] ITCHING   ? Lisinopril SEE COMMENTS     'feels wiped out/like crap on medication'   ? Losartan SEE COMMENTS     'feels wiped out/like crap on medication'   ? Norvasc [Amlodipine] SEE COMMENTS     'feels wiped out/like crap on medication'     Rennis Golden, RN

## 2022-02-07 ENCOUNTER — Ambulatory Visit: Admit: 2022-02-07 | Discharge: 2022-02-07 | Payer: MEDICARE

## 2022-02-07 ENCOUNTER — Encounter: Admit: 2022-02-07 | Discharge: 2022-02-07 | Payer: MEDICARE

## 2022-02-07 DIAGNOSIS — I5189 Other ill-defined heart diseases: Secondary | ICD-10-CM

## 2022-02-07 MED ORDER — SODIUM CHLOR 0.9% BACTERIOSTAT 0.9 % IJ SOLN
20 mL | Freq: Once | INTRAMUSCULAR | 0 refills | Status: AC
Start: 2022-02-07 — End: ?

## 2022-02-07 MED ORDER — PROPOFOL 10 MG/ML IV EMUL 20 ML (INFUSION)(AM)(OR)
INTRAVENOUS | 0 refills | Status: DC
Start: 2022-02-07 — End: 2022-02-07
  Administered 2022-02-07: 16:00:00 150 ug/kg/min via INTRAVENOUS

## 2022-02-07 MED ORDER — LIDOCAINE (PF) 20 MG/ML (2 %) IJ SOLN
INTRAVENOUS | 0 refills | Status: DC
Start: 2022-02-07 — End: 2022-02-07

## 2022-02-07 MED ORDER — PHENYLEPHRINE HCL IN 0.9% NACL 1 MG/10 ML (100 MCG/ML) IV SYRG
INTRAVENOUS | 0 refills | Status: DC
Start: 2022-02-07 — End: 2022-02-07

## 2022-02-07 MED ORDER — PROPOFOL INJ 10 MG/ML IV VIAL
INTRAVENOUS | 0 refills | Status: DC
Start: 2022-02-07 — End: 2022-02-07

## 2022-02-07 MED ORDER — FENTANYL CITRATE (PF) 50 MCG/ML IJ SOLN
INTRAVENOUS | 0 refills | Status: DC
Start: 2022-02-07 — End: 2022-02-07

## 2022-02-07 MED ORDER — SODIUM CHLORIDE 0.9 % IV SOLP (OR) 500ML
INTRAVENOUS | 0 refills | Status: DC
Start: 2022-02-07 — End: 2022-02-07

## 2022-02-07 NOTE — H&P (View-Only)
Cardiovascular Medicine H&PNote   Admission Date: 02/07/2022  Date of Consultation:  02/07/2022  LOS: 0 days  Requesting Physician: No admitting provider for patient encounter.  Code Status: Prior    Reason for Consultation  Opinion and recommendations    Assessment & Plan   This is a 51 y.o. female is here for evaluation of severity of mitral regurgitation with transesophageal ultrasound.      #Heart failure with preserved ejection fraction  #Exercise use diastolic dysfunction  #Hypertension  #Moderatre Mitral Regurgitation    Impression and Recommendations:  > Euvolemic on examination today.  > Continue current medication regimen.  > Will proceed with transesophageal ultrasound today.  > Outpatient follow-up with regular cardiologist.    Discussed with Dr. Maisie Fus.     Misty Stanley, MBBS  PGY-6 Cardiovascular Medicine  [phone]    Subjective   History of Present Illness:  Katelyn Wu is a 51 y.o. female patient with history of heart failure with preserved ejection fraction, exertional dyspnea, mitral regurgitation who has been referred for transesophageal ultrasound for evaluation of her severity of mitral regurgitation.  Today she complains of severe fatigue on walking a few steps and even while taking a shower.  She also has associated shortness of breath.  She has mild lower extremity edema.  She denies orthopnea or PND.  She has not passed out.  She does have occasional sharp chest pain as well.  No other complaints.    She is compliant with her medications.  Her blood pressure today is 137/80 with a heart rate of 74 bpm she is in normal sinus rhythm.  She is euvolemic on examination today.    Telemetry Review: Normal sinus rhythm, no significant arrhythmias.     Past Medical History:  Medical History:   Diagnosis Date   ? Asthma    ? Back pain    ? COPD mixed type (HCC)     and asthma   ? Stomach problems    ? Ulcer of the stomach and intestine        Social History:  Social History Socioeconomic History   ? Marital status: Single   Occupational History     Comment: paraprofessional   Tobacco Use   ? Smoking status: Never   ? Smokeless tobacco: Never   Vaping Use   ? Vaping Use: Never used   Substance and Sexual Activity   ? Alcohol use: No   ? Drug use: No   ? Sexual activity: Not Currently       Surgical History:  Surgical History:   Procedure Laterality Date   ? ANORECTAL MANOMETRY N/A 06/18/2017    Performed by Jolee Ewing, MD at [facility] ENDO   ? flex sig N/A 07/23/2017    Performed by Jolee Ewing, MD at [facility] OR   ? ROBOT VENTRAL MESH RECTOPEXY N/A 02/08/2018    Performed by Benetta Spar, MD at [facility] OR   ? REPAIR RECTOCELE  02/08/2018    Performed by Benetta Spar, MD at [facility] OR   ? EGD N/A 04/16/2018    Performed by Remigio Eisenmenger, MD at [facility] ENDO   ? ESOPHAGOGASTRODUODENOSCOPY WITH BIOPSY - FLEXIBLE  04/16/2018    Performed by Remigio Eisenmenger, MD at [facility] ENDO   ? HEMORRHOIDECTOMY - 2 OR MORE COLUMNS/ GROUPS - INTERNAL AND EXTERNAL N/A 12/12/2020    Performed by Costella Hatcher, DO at [facility] OR   ? ANGIOGRAPHY CORONARY ARTERY WITH RIGHT AND LEFT HEART  CATHETERIZATION WITH POSSIBLE EXERCISE (ARM WEIGHTS) N/A 07/15/2021    Performed by Julienne Kass, MD at [facility] CATH LAB   ? POSSIBLE PERCUTANEOUS CORONARY STENT PLACEMENT WITH ANGIOPLASTY N/A 07/15/2021    Performed by Julienne Kass, MD at [facility] CATH LAB   ? HX HYSTERECTOMY     ? HYSTERECTOMY      2009   ? TUBAL LIGATION         Family History:  Family History   Problem Relation Age of Onset   ? Coronary Artery Disease Mother    ? Heart Disease Mother    ? Heart Surgery Mother        Review of Systems:  ROS  Gen: +fatigue   HEENT: Normal   Resp: Normal  CV: + Shortness of breath, chest pain   abd: Normal  GU: Normal  Musculoskeletal: Normal  Heme: Normal   Endo: Normal  Derm: Normal  Psych: Normal   Neuro: Normal  Extremities: Normal    Physical Exam                          Vital Signs: Most Recent                 Vital Signs: 24 Hour Range   BP: 113/76 (10/13 0941)  Temp: 36.6 ?C (97.9 ?F) (10/13 4540)  Pulse: 71 (10/13 0941)  Respirations: 18 PER MINUTE (10/13 0941)  SpO2: 100 % (10/13 0941)  O2 Device: None (Room air) (10/13 0941)  Height: 170.2 cm (5' 7) (10/13 0948) BP: (113)/(76)   Temp:  [36.6 ?C (97.9 ?F)]   Pulse:  [71]   Respirations:  [18 PER MINUTE]   SpO2:  [100 %]   O2 Device: None (Room air)     Vitals:    02/07/22 0949   Weight: 77.1 kg (170 lb)     No intake or output data in the 24 hours ending 02/07/22 1020     Physical Exam  GENERAL: The patient is well developed, well nourished, resting comfortably and in no distress.   HEENT: No abnormalities of the visible oro-nasopharynx, conjunctiva or sclera are noted.  NECK: There is no jugular venous distension. Carotids are palpable and without bruits. There is no thyroid enlargement.  Chest: Lung fields are clear to auscultation. There are no wheezes or crackles.  CV: There is a regular rhythm. The first and second heart sounds are normal. There is a  holosystolic murmur at the apex without radiation.   ABD: The abdomen is soft and supple with normal bowel sounds. There is no hepatosplenomegaly, ascites, tenderness, masses or bruits.  Neuro: There are no focal motor defects. Ambulation is normal. Cognitive function appears normal.  Ext: There is no edema or evidence of deep vein thrombosis. Peripheral pulses are satisfactory.    SKIN: There are no rashes and no cellulitis  PSYCH: The patient is calm, rationale and oriented.      Objective   Medications:        Allergies:  Allergies   Allergen Reactions   ? Atorvastatin ITCHING   ? Farxiga [Dapagliflozin] ITCHING   ? Lisinopril SEE COMMENTS     'feels wiped out/like crap on medication'   ? Losartan SEE COMMENTS     'feels wiped out/like crap on medication'   ? Norvasc [Amlodipine] SEE COMMENTS     'feels wiped out/like crap on medication'       Labs:  24-hour  labs:  No results found for this visit on 02/07/22 (from the past 24 hour(s))., Cardiac markers:  No results found for: TNI, CKMB, MYOGLB, HgbA1C: No results found for: HGBA1C, and Lipid Profile:   Lab Results   Component Value Date    CHOL 215 10/18/2020    TRIG 65 10/18/2020    HDL 70 10/18/2020    LDL 132 10/18/2020    VLDL 13 10/18/2020           Misty Stanley, MBBS  PGY-6 Cardiovascular Medicine  [phone]

## 2022-02-07 NOTE — Anesthesia Pre-Procedure Evaluation
Anesthesia Pre-Procedure Evaluation    Name: Katelyn Wu      MRN: [id number]     DOB: Nov 07, 1970     Age: 51 y.o.     Sex: female   _________________________________________________________________________     Procedure Info:   Procedure Information     Date/Time: 02/07/22 1030    Scheduled providers: Tanna Furry, MD    Procedure: TRANSESOPHAGEAL ECHO    Location: Cardi[facility]: [facility]          Physical Assessment  Vital Signs (last filed in past 24 hours):         Echo 01/21/22:  1. Normal left ventricular cavitary dimensions with mild eccentric hypertrophy  2. Normal left ventricular systolic function with a visually estimated ejection fraction of ~65%.  3. Probably normal right ventricular cavitary size with probably normal RV systolic function.  4. Mild/grade 1 LV diastolic dysfunction with normal left atrial pressure  5. No left ventricular focal regional wall motion abnormality.  6. No chamber enlargement.  7. No hemodynamically significant valvular stenosis   8. Moderate, eccentric, posteriorly directed mitral insufficiency.  No significant mitral stenosis: Mean gradient 2 mmHg at 73 bpm.  The severely eccentric jet may underestimate the underlying severity of MR.  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 Estimated PA systolic pressure is 24 mmHg plus CVP: IVC not well-visualized.  10. No pericardial effusion.    Cath 07/15/21:  1. Patent epicardial coronary arteries with normal TIMI-3 flow and no significant obstructive coronary artery disease.  2. Normal left ventricular end-diastolic pressure without aortic stenosis at baseline.  3. Normal filling pressures at baseline with significant worsening of pulmonary artery pressure to 35 mmHg with a reduction in pulmonary artery saturation indicative of exercise-induced diastolic dysfunction.  4. Advanced Heart Failure/Transplant team referral.      Patient History   Allergies   Allergen Reactions   ? Atorvastatin ITCHING   ? Farxiga [Dapagliflozin] ITCHING   ? Lisinopril SEE COMMENTS     'feels wiped out/like crap on medication'   ? Losartan SEE COMMENTS     'feels wiped out/like crap on medication'   ? Norvasc [Amlodipine] SEE COMMENTS     'feels wiped out/like crap on medication'        Current Medications    Medication Directions   CHOLEcalciferoL (vitamin D3) (OPTIMAL D3) 50,000 units capsule Take one capsule by mouth every 7 days.   eplerenone (INSPRA) 25 mg tablet Take two tablets by mouth daily.   fluticasone propion/salmeterol (ADVAIR DISKUS IN) Inhale  by mouth into the lungs twice daily. 500/50   isosorbide mononitrate ER (IMDUR) 30 mg tablet,extended release 24 hr Take one-half tablet by mouth every morning.   nitroglycerin (NITROSTAT) 0.4 mg tablet Place one tablet under tongue every 5 minutes as needed for Chest Pain. Max of 3 tablets, call 911.   pravastatin (PRAVACHOL) 20 mg tablet Take one tablet by mouth at bedtime daily.   valsartan (DIOVAN) 40 mg tablet Take one tablet by mouth daily.   YUVAFEM 10 mcg vaginal tablet Insert or Apply  to vaginal area twice weekly.       Review of Systems/Medical History      Patient summary reviewed  Nursing notes reviewed  Pertinent labs reviewed    PONV Screening: Non-smoker and Female sex  No history of anesthetic complications  No family history of anesthetic complications      Airway - negative  Pulmonary      Not a current smoker        No indications/hx of asthma    no COPD      Shortness of breath      Cardiovascular         Exercise tolerance: >4 METS      Beta Blocker therapy: No      Beta blockers within 24 hours: n/a      No hypertension,           Valvular problems/murmurs:MR          No past MI:        No hx of coronary artery disease      No coronary artery bypass graft      No PTCA        No palpitations        No dysrhythmias      CHF Diastolic        No hyperlipidemia      Orthopnea      Dyspnea on exertion      GI/Hepatic/Renal No GERD,       No liver disease:       No renal disease:           Rectocele      Neuro/Psych           Psychiatric history          Depression          Anxiety      Musculoskeletal         Back pain         Physical Exam    Airway Findings      Mallampati: I      Neck ROM: full      Mouth opening: good      Airway patency: adequate    Dental Findings: Negative      Cardiovascular Findings:       Rhythm: regular      Rate: normal    Pulmonary Findings:       Breath sounds clear to auscultation.    Neurological Findings:       Alert and oriented x 3    Constitutional findings:       No acute distress       Previous Airway Procedure Notes Displaying the 3 most recent records   No records found.         Patient Lines/Drains/Airways Status     Active Lines:     None              Diagnostic Tests  Hematology:   Lab Results   Component Value Date    HGB 14.5 09/24/2021    HCT 42.8 09/24/2021    PLTCT 321 09/24/2021    WBC 7.15 09/24/2021    NEUT 64.2 09/24/2021    ANC 4.58 09/24/2021    ALC 1.74 09/24/2021    MONA 5.6 09/24/2021    AMC 0.4 09/24/2021    ABC 0.06 09/24/2021    MCV 89.7 09/24/2021    MCH 30.4 09/24/2021    MCHC 33.9 09/24/2021    MPV 9.3 09/24/2021    RDW 39.5 09/24/2021         General Chemistry:   Lab Results   Component Value Date    NA 139 10/31/2021    K 4.0 10/31/2021    CL 105 10/31/2021    CO2 27.0 10/31/2021    GAP 7 10/31/2021  BUN 17.0 10/31/2021    CR 0.95 10/31/2021    GLU 91 10/31/2021    CA 9.7 10/31/2021    MG 1.8 02/10/2018    PO4 4.0 02/10/2018      Coagulation: No results found for: PT, PTT, INR    PAC Plan    Anesthesia Plan    ASA score: 3   Plan: MAC  Induction method: intravenous  NPO status: acceptable      Informed Consent  Anesthetic plan and risks discussed with patient.        Plan discussed with: anesthesiologist and CRNA.      Alerts

## 2022-02-07 NOTE — Anesthesia Post-Procedure Evaluation
Post-Anesthesia Evaluation    Name: Katelyn Wu      MRN: [id number]     DOB: 1971-04-07     Age: 51 y.o.     Sex: female   __________________________________________________________________________     Procedure Information     Anesthesia Start Date/Time: 02/07/22 1026    Scheduled providers: Irena Cords, MD; Lurlean Horns, RN    Procedure: TRANSESOPHAGEAL ECHO    Location: Cardiovascular Medicine: [facility]  BP: 115/70 (10/13 1127)  Pulse: 76 (10/13 1125)  Respirations: 16 PER MINUTE (10/13 1125)  SpO2: 97 % (10/13 1125)  O2 Device: None (Room air) (10/13 1125)  Height: 170.2 cm ('5\' 7"'$ ) (10/13 1127)   Vitals Value Taken Time   BP 115/70 02/07/22 1127   Temp     Pulse 76 02/07/22 1125   Respirations 16 PER MINUTE 02/07/22 1125   SpO2 97 % 02/07/22 1125   O2 Device None (Room air) 02/07/22 1125   ABP     ART BP           Post Anesthesia Evaluation Note    Evaluation location: Pre/Post  Patient participation: recovered; patient participated in evaluation  Level of consciousness: alert    Pain score: 0  Pain management: adequate    Hydration: normovolemia  Temperature: 36.0C - 38.4C  Airway patency: adequate    Perioperative Events       Post-op nausea and vomiting: no PONV    Postoperative Status  Cardiovascular status: hemodynamically stable  Respiratory status: spontaneous ventilation  Follow-up needed: none        Perioperative Events  There were no known notable events for this encounter.

## 2022-02-07 NOTE — Progress Notes
Care Plan   Care Category & Patient Outcome Goal Met Treatment/  Interventions Plan of the Day RN Name   Cardiovascular  Hemodynamic stability and adequate peripheral perfusion.   Yes   Refer to  patient's chart. Monitor VS per sedation standard. Monitor ECG continuously.  Assess/maintain IV patency.   Lurlean Horns, RN   Respiratory  Patent airway, ease of respiration, and adequate oxygenation.   Yes   Refer to  patient's chart. Maintain open airway. Assess respirations. Monitor O2 saturations. Titrate O2 to keep sat>= 95% or baseline.   Lurlean Horns, RN   Psychological/Emotional/Spiritual  Cope with procedure with support in place.  Spiritual needs are addressed.     Yes   Refer to  patient's chart. Provide adequate and thorough instructions.  Provide a caring and supportive environment.  Communicate patients concerns with other members of the health team.   Lurlean Horns, RN   Pain  Patients pain goal met.   Yes   Refer to  patient's chart. Prepare patient for potentially uncomfortable procedure.  Observe for verbal/nonverbal complaints of pain.  Assess pain.  Provide comfort measures.   Lurlean Horns, RN   Safety/Fall Risk  Free from injury, security maintained.   Yes High Risk    Refer to  patient's chart.   Follow nursing standard of practice for high risks fall patients.   Lurlean Horns, RN   Knowledge Base  Verbalize understanding of procedure/information provided.   Yes   Refer to  patient's chart. Describe the procedure along with what symptoms to expect.  Evaluate patients understanding of procedure.  Encourage patient to ask questions.  Provide additional information as needed.   Lurlean Horns, RN

## 2022-02-07 NOTE — Progress Notes
Pre-Operative Assessment for TEE or Cardioversion    Date of Service:  02/07/2022    Katelyn Wu is a 51 y.o. y.o. female. With significant complaint of SOA and Fatigue, who is referred for TEE Indication: Mitral Valve Evaluation .      she has been compliant with her  none Missed dose: N/A.     GI procedures:EGD            When: When/Date: 2019    Chest pain:  No   SOB: No         Medical History:  Medical History:   Diagnosis Date   ? Asthma    ? Back pain    ? COPD mixed type (HCC)     and asthma   ? Stomach problems    ? Ulcer of the stomach and intestine         Surgical History:   Surgical History:   Procedure Laterality Date   ? ANORECTAL MANOMETRY N/A 06/18/2017    Performed by Jolee Ewing, MD at [facility] ENDO   ? flex sig N/A 07/23/2017    Performed by Jolee Ewing, MD at [facility] OR   ? ROBOT VENTRAL MESH RECTOPEXY N/A 02/08/2018    Performed by Benetta Spar, MD at [facility] OR   ? REPAIR RECTOCELE  02/08/2018    Performed by Benetta Spar, MD at [facility] OR   ? EGD N/A 04/16/2018    Performed by Remigio Eisenmenger, MD at [facility] ENDO   ? ESOPHAGOGASTRODUODENOSCOPY WITH BIOPSY - FLEXIBLE  04/16/2018    Performed by Remigio Eisenmenger, MD at [facility] ENDO   ? HEMORRHOIDECTOMY - 2 OR MORE COLUMNS/ GROUPS - INTERNAL AND EXTERNAL N/A 12/12/2020    Performed by Costella Hatcher, DO at [facility] OR   ? ANGIOGRAPHY CORONARY ARTERY WITH RIGHT AND LEFT HEART CATHETERIZATION WITH POSSIBLE EXERCISE (ARM WEIGHTS) N/A 07/15/2021    Performed by Julienne Kass, MD at [facility] CATH LAB   ? POSSIBLE PERCUTANEOUS CORONARY STENT PLACEMENT WITH ANGIOPLASTY N/A 07/15/2021    Performed by Julienne Kass, MD at [facility] CATH LAB   ? HX HYSTERECTOMY     ? HYSTERECTOMY      2009   ? TUBAL LIGATION         Social History     Social History     Tobacco Use   ? Smoking status: Never   ? Smokeless tobacco: Never   Vaping Use   ? Vaping Use: Never used   Substance Use Topics   ? Alcohol use: No   ? Drug use: No         Allergies Allergies   Allergen Reactions   ? Atorvastatin ITCHING   ? Farxiga [Dapagliflozin] ITCHING   ? Lisinopril SEE COMMENTS     'feels wiped out/like crap on medication'   ? Losartan SEE COMMENTS     'feels wiped out/like crap on medication'   ? Norvasc [Amlodipine] SEE COMMENTS     'feels wiped out/like crap on medication'          Current Medications  Current Outpatient Medications on File Prior to Encounter   Medication Sig Dispense Refill   ? CHOLEcalciferoL (vitamin D3) (OPTIMAL D3) 50,000 units capsule Take one capsule by mouth every 7 days.     ? eplerenone (INSPRA) 25 mg tablet Take two tablets by mouth daily. 180 tablet 3   ? fluticasone propion/salmeterol (ADVAIR DISKUS IN) Inhale  by mouth into the  lungs twice daily. 500/50     ? isosorbide mononitrate ER (IMDUR) 30 mg tablet,extended release 24 hr Take one-half tablet by mouth every morning. 45 tablet 3   ? nitroglycerin (NITROSTAT) 0.4 mg tablet Place one tablet under tongue every 5 minutes as needed for Chest Pain. Max of 3 tablets, call 911. 25 tablet 3   ? pravastatin (PRAVACHOL) 20 mg tablet Take one tablet by mouth at bedtime daily. 90 tablet 1   ? valsartan (DIOVAN) 40 mg tablet Take one tablet by mouth daily. 30 tablet 3   ? YUVAFEM 10 mcg vaginal tablet Insert or Apply  to vaginal area twice weekly.       No current facility-administered medications on file prior to encounter.       Vitals  Estimated body mass index is 26.63 kg/m? as calculated from the following:    Height as of this encounter: 170.2 cm (5' 7).    Weight as of this encounter: 77.1 kg (170 lb).       Patient appears alert and oriented: Yes  NPO: for greater than 8 hours  Inpatient IV status: Lac #22 Sl placed today    Diagnostic Tests  White Blood Cells   Date Value Ref Range Status   09/24/2021 7.15 3.98 - 10.04 Final     Hemoglobin   Date Value Ref Range Status   09/24/2021 14.5 11.2 - 15.7 Final     Hematocrit   Date Value Ref Range Status   09/24/2021 42.8 34.1 - 44.9 Final     Platelet Count   Date Value Ref Range Status   09/24/2021 321 182 - 369 Final     Sodium   Date Value Ref Range Status   10/31/2021 139  Final     Potassium   Date Value Ref Range Status   10/31/2021 4.0  Final     Magnesium   Date Value Ref Range Status   02/10/2018 1.8 1.6 - 2.6 mg/dL Final     Blood Urea Nitrogen   Date Value Ref Range Status   10/31/2021 17.0  Final     Creatinine   Date Value Ref Range Status   10/31/2021 0.95  Final     Glucose   Date Value Ref Range Status   10/31/2021 91  Final       Last MAC INR Flow Sheet Entry:    Last recorded Lab results:   No results found for: INR  No results found for: PTT        Blood Cultures  Resulted Micro Last 72 Hrs    No results found         Last TEE date: None  Last Cardioversion date: None  Echo procedures within the past 30 days:  No results found.      Device Information on File  No results found for: GENERATOR, EPDEVTYP      Additional Comments:  None    Plan:  Dr. Maisie Fus will plan to proceed with the  TEE.

## 2022-02-07 NOTE — Patient Instructions
CARDIOLOGY PROCEDURES           POST SEDATION INSTRUCTIONS      Patient Name: Katelyn Wu  MRN#: [id number]  Date: 02/07/2022      Please follow the instructions listed below:    Resume diet as prescribed when gag reflex is present.  This typically occurs within one hour.    The following day you may experience a minor sore throat.    Please have someone accompany you, as YOU SHOULD NOT drive or operate machinery for at least 12-24 hours following the procedure.    There may be some residual effects from the sedatives during the procedure.  Do not drive a vehicle for up to 24 hours after receiving sedation.  Do not operate heavy or potentially harmful equipment  Do not make legally binding decisions  Do not drink alcohol for up to 24 hours  Do not communicate through social media for 24 hours.    Other instructions: You may resume diet, medication, and self-care prior to procedure unless otherwise specified.    If you have question or concerns about this procedure, please contact the Cardiology office at [phone], and ask to speak to one of the nurses.      Current Medications List:   CHOLEcalciferoL (vitamin D3) (OPTIMAL D3) 50,000 units capsule Take one capsule by mouth every 7 days.    eplerenone (INSPRA) 25 mg tablet Take two tablets by mouth daily.    fluticasone propion/salmeterol (ADVAIR DISKUS IN) Inhale  by mouth into the lungs twice daily. 500/50    isosorbide mononitrate ER (IMDUR) 30 mg tablet,extended release 24 hr Take one-half tablet by mouth every morning.    nitroglycerin (NITROSTAT) 0.4 mg tablet Place one tablet under tongue every 5 minutes as needed for Chest Pain. Max of 3 tablets, call 911.    pravastatin (PRAVACHOL) 20 mg tablet Take one tablet by mouth at bedtime daily.    valsartan (DIOVAN) 40 mg tablet Take one tablet by mouth daily.    YUVAFEM 10 mcg vaginal tablet Insert or Apply  to vaginal area twice weekly.         Instructions Given To: pt and designated driver    Instructions Given By: Vangie Bicker, RN

## 2022-02-13 ENCOUNTER — Encounter: Admit: 2022-02-13 | Discharge: 2022-02-13 | Payer: MEDICARE

## 2022-02-27 ENCOUNTER — Encounter: Admit: 2022-02-27 | Discharge: 2022-02-27 | Payer: MEDICARE

## 2022-02-27 NOTE — Progress Notes
Date of Service: 03/03/2022      HPI       Patient is 51 y.o. who follows with Dr. Steele Sizer (last seen 11/29/2021). She has a medical history of chronic dyspnea on exertion, hypertension, heart failure with preserved ejection fraction, moderate mitral regurgitation.    Katelyn Wu was seen today in HF clinic for routine follow up visit via telehealth. She was last seen in clinic on 11/29/2021 by Dr. Steele Sizer. During that office visit they discussed etiology of her heart failure which appears to be exercise-induced based on her rise in PA pressures with exercise.  At rest, she had normal filling pressures.  With exercise, her PA pressure increased to 53/25.  On left heart catheterization March 2023 she did not have epicardial coronary artery disease.  She was started on Entresto 24-26 twice daily and continued on eplerenone.  Plan to start Jardiance during her next visit.  She has not tolerated Farxiga due to itching.    Echocardiogram 01/21/2022 showed LVEF 65%, probably normal right ventricular cavitary size and probably normal RV systolic function.  Grade 1 LV diastolic dysfunction with normal left atrial pressure.  No left ventricular focal regional wall motion abnormality.  No hemodynamically significant valvular stenosis.  Moderate mitral insufficiency.  This was reviewed by Dr. Steele Sizer who commented that her mitral regurgitation is consistent with previous echocardiograms.    Today, she reports she has felt poorly and worse for the last month or so. She went to urgent care for productive cough 10/30 and was prescribed prednisone taper. She has ongoing chronic fatigue and dyspnea on exertion that she has been dealing with for about 1 year. She has ankle swelling that occurs at the end of the day after being on her feet. Medications do not seem to improve her symptoms. Her weight has been stable at home ~170 lb. She takes her blood pressure often and readings this morning have ranged 120-140/75-106. HR ~91-100. These readings are with activity. I explained a good time to check her blood pressure would around 1-2 hours after taking her morning medications after sitting in a chair for a few minutes with both feet flat on the ground. She said the one medication she takes (valsartan) she takes at night. She stopped taking the eplerenone about 2 months ago because it bottomed out her blood pressure when she took it with the valsartan. She reported readings as low as 96/76. I explained she could take the eplerenone in the morning at a reduced dose and continue the valsartan at night which her blood pressure should be able to tolerate. She did not want to restart the eplerenone at all.     Discussed Alt-flow trial. She understands that enrollment is not guaranteed. Discussed adding Jardiance to her medication regimen and she is in agreement to trying it. Discussed risks/benefits/side effects.    She really would like her mitral valve assessed and thinks that fixing the mitral valve will help with her symptoms. I explained that the valve is stable, and I would reach out to Dr. Sherryll Burger to see his thoughts on referral to the valve clinic.          Vitals:    03/03/22 0803   O2 Device: None (Room air)     There is no height or weight on file to calculate BMI.    Physical exam limited d/t video tele health visit:  General Appearance: patient in no apparent distress.  Skin: no jaundice, exposed skin  is intact  Eyes: conjunctivae and lids normal, pupils are equal and round   Lips: no pallor or cyanosis   Neck Veins: VESSELS: JVP difficult to appreciate    Respiratory Effort: breathing comfortably, no respiratory distress   Lower extremities: no edema  Orientation: oriented to time, place and person   PSYCH: Appropriate   Language and Memory: patient responsive and seems to comprehend information   NEURO: Alert and conversant    Cardiovascular Studies  Echocardiogram 01/21/2022  Interpretation Summary  1. Normal left ventricular cavitary dimensions with mild eccentric hypertrophy  2. Normal left ventricular systolic function with a visually estimated ejection fraction of ~65%.  3. Probably normal right ventricular cavitary size with probably normal RV systolic function.  4. Mild/grade 1 LV diastolic dysfunction with normal left atrial pressure  5. No left ventricular focal regional wall motion abnormality.  6. No chamber enlargement.  7. No hemodynamically significant valvular stenosis   8. Moderate, eccentric, posteriorly directed mitral insufficiency.  No significant mitral stenosis: Mean gradient 2 mmHg at 73 bpm.  The severely eccentric jet may underestimate the underlying severity of MR.  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 Estimated PA systolic pressure is 24 mmHg plus CVP: IVC not well-visualized.  10. No pericardial effusion.  ???  Direct visual comparison was made with a prior transthoracic echocardiogram dated August 16, 2021.  Both studies demonstrate similar biventricular function and wall motion pattern.  Extent of MR was moderate on the previous study, unchanged.  Consider transesophageal echocardiography for better assessment of the severity of MR if clinically indicated  ???  Echocardiogram 08/16/2021  Interpretation Summary  11. LV size is normal and overall systolic function also appears to be within normal limits.  EF is about 55%.  12. Normal diastolic function   13. Normal right ventricular size and systolic function  14. Normal biatrial size  15. Normal central venous pressure  16. 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  There is a very eccentric posteriorly directed jet of likely moderate mitral regurgitation.  No stenosis  17. Other valves without structural or functional abnormalities  18. Unable to assess pulmonary artery systolic pressure  19. No pericardial effusion  ???  No prior studies available in our system.  The eccentric mitral valve jet does not appear to be severe but if clinically there is a concern then a transesophageal echocardiogram could be performed    Left and right heart catheterization 07/15/2021  BASELINE HEMODYNAMICS:    1. Blood pressure 138/84, mean 105 mmHg.  2. Heart rate 66 beats per minute.  3. Body surface area 1.9 sq m.  4. Hemoglobin percentage 13.4 g/dL.  ???  SATURATIONS:    1. Aorta saturation 96%.  2. RA saturation 63%.  3. PA saturation 65%.  ???  PRESSURES:    1. Right atrial pressure is 5 mmHg.  2. Right ventricular pressure 31/1, CVP 7 mmHg.  3. PA pressure 31/11, mean 18 mmHg.  4. Pulmonary capillary wedge pressure 11 mmHg.  5. Transpulmonic gradient 7 mmHg.  6. Cardiac output by thermodilution technique was 4 L/minute with an index of 2.1 L/minute per sq m.  7. Cardiac output by Fick method was also 4 L/minute with an index of 2.1 L/minute per sq m.  ???  FLUID BOLUS CHALLENGE:  500 mL was administered intraprocedurally followed by repeat hemodynamics which are as follows:  1. PA saturation 67%.  2.  PA pressure 34/14, mean 21 mmHg.  3. Cardiac output by thermodilution technique was 4.6 L/minute with an index of 2.4 L/minute per sq m.  4. Cardiac output by Fick method was 3.1 L/minute with an index of 1.6 L/minute per sq m.   ???  The patient allowed to exercise for a total of 5 minutes on the table with arm raises and subsequently after 750 mL of fluid bolus, we repeated the hemodynamics, which are as follows.  1. PA saturation was 59%.  2. PA pressure was 53/25, mean 35 mmHg.  3. Cardiac output by thermodilution technique was 5 L/minute with an index of 2.6 L/minute per sq m.  4. Cardiac output by Fick method was 2.6 L/minute with an index of 1.4 L/minute per sq m, which is indicative of exercise-induced diastolic dysfunction.  ???  LEFT HEART CATHETERIZATION:  Tiger catheter was used for LV pressure assessment, selective right and left native coronary angiography.  ???  LEFT VENTRICULAR HEMODYNAMICS:    1. LV systolic blood pressure was 126 mmHg.  2. LVEDP was 10 mmHg.  There was no gradient across the aortic valve on catheter pullback.  ???  DETAILS OF CORONARY ANGIOGRAM:    1. Left main:  The left main is a short vessel with no significant angiographic disease along its entire course.  This is a large caliber vessel, which bifurcates into a left anterior descending artery and a left circumflex artery.    2. Left anterior descending artery.  Large type 3 configuration.  Proximal to distal portion, there is minor plaquing, but no significant obstructive disease.  It gives rise to 2 small diagonal branches with minor plaquing, but no significant focal obstructive disease is noted.  3. Left circumflex artery.  In the proximal and distal portions, it is a large caliber vessel with minor plaquing.  It gives rise to 2 large obtuse marginal vessels, both of which have minor plaquing, but no significant obstructive disease is noted.  4. Right coronary artery:  The right coronary artery is a large dominant vessel.  Proximal and distal portions have minor plaquing.  It bifurcates at a high level to a PDA and a PLV.  Both of these are moderate caliber vessels with minor plaquing, but no significant focal obstructive stenosis is noted.    FINAL IMPRESSION:    1. Patent epicardial coronary arteries with normal TIMI-3 flow and no significant obstructive coronary artery disease.  2. Normal left ventricular end-diastolic pressure without aortic stenosis at baseline.  3. Normal filling pressures at baseline with significant worsening of pulmonary artery pressure to 35 mmHg with a reduction in pulmonary artery saturation indicative of exercise-induced diastolic dysfunction.  4. Advanced Heart Failure/Transplant team referral.  ???  Cardiovascular Health Factors  Vitals BP Readings from Last 3 Encounters:   02/07/22 115/70   01/21/22 126/80   08/16/21 (!) 148/92     Wt Readings from Last 3 Encounters:   02/07/22 77.1 kg (170 lb)   01/21/22 80.7 kg (178 lb)   10/28/21 81.2 kg (179 lb)     BMI Readings from Last 3 Encounters:   02/07/22 26.63 kg/m???   01/21/22 27.88 kg/m???   10/28/21 28.04 kg/m???      Smoking Social History     Tobacco Use   Smoking Status Never   Smokeless Tobacco Never      Lipid Profile Cholesterol   Date Value Ref Range Status   10/18/2020 215  Final     HDL  Date Value Ref Range Status   10/18/2020 70  Final     LDL   Date Value Ref Range Status   10/18/2020 132  Final     Triglycerides   Date Value Ref Range Status   10/18/2020 65  Final      Blood Sugar No results found for: HGBA1C  Glucose   Date Value Ref Range Status   10/31/2021 91  Final   07/19/2021 84 70 - 100 MG/DL Final   16/01/9603 88 70 - 100 MG/DL Final            Problems Addressed Today  Encounter Diagnoses   Name Primary?   ??? Chronic diastolic heart failure (HCC) Yes   ??? Primary hypertension    ??? Chronic heart failure with preserved ejection fraction (HCC)    ??? Moderate mitral regurgitation    ??? Diabetes mellitus screening    ??? Other specified abnormal findings of blood chemistry               Assessment/Plan:    Exercise-induced HFpEF   -Cardiac MRI showed no significant LGE that suggest an infiltrative process.  At rest, she has normal filling pressures.  With exercise, her PA pressures increased to 53/25.  -She has been referred for evaluation of Alt-Flow 2 trial when active  - NYHA Functional Class II, AHA stage C (structural heart disease with prior or current symptoms of HF) symptoms.    - Estimated dry weight: 170 lb    - Patient appears euvolemic today     Diuretics  Current Dose Changes   None        GDMT Current Dose Changes   ACEI/ARB/ARNI  valsartan 40 mg daily    Aldosterone Antagonist  eplerenone 50 mg daily  *patient stopped taking ~September 2023. Will remove from medication list Consider re-introducing at future visit if patient agreeable. 11/6 she is not agreeable to starting.    SGLT-2 Inhibitor  none, itching with Farxiga 11/6: start Jardiance 5 mg daily pending labs     > As discussed in HPI, she stopped taking eplerenone around 2 months ago as this was bottoming out her blood pressure when taking it with the valsartan. Discussed options to restart at a lower dose and to take it in the morning and continue valsartan at night but patient refused. Discussed benefits of medication and potential of restarting it at future visit.   > She is agreeable to Jardiance 5 mg daily. Discussed risks/benefits/side effects. She has not had an A1c. Will draw BMP and hemoglobin A1C today. If stable, anticipate starting Jardiance 5 mg po daily. Will plan to send to [facility] pharmacy for PA as she is concerned about cost.   > Patient asked about intervention of mitral valve. I explained her MR is moderate and stable. Will reach out to Dr. Sherryll Burger to see if referral to valve clinic would be beneficial for patient.   - Follow-up: with T. Kendria Halberg, APRN in one month with plan to uptitrate Jardiance to 10 mg daily and potentially restart eplerenone if pt open.     CAD Surveillance  - last ischemic evaluation: Left heart catheterization March 2023 showed no significant obstructive coronary artery disease (full report above)    Mitral regurgitation- moderate  > Stable on most recent echocardiogram    Hypertension  - continue medications as noted above  - controlled    Renal Surveillance  - recent creatinine trends <1.0  - last creatinine:   Lab  Results   Component Value Date/Time    CR 0.95 10/31/2021 12:00 AM     DMII Screening  - Hgb A1c today, 11/2 prior to initiation of Jardiance    Follow up  Appointment scheduled to return to clinic in one month with T. Meshia Rau, APRN. She is in agreement and states understanding of this plan. She was encouraged to reach out to our clinic with any questions, concerns, or change in condition. Please see AVS for full patient education.          Total Time Today was 45 minutes in the following activities: Preparing to see the patient, Obtaining and/or reviewing separately obtained history, Performing a medically appropriate examination and/or evaluation, Counseling and educating the patient/family/caregiver, Ordering medications, tests, or procedures as appropriate, Referring and communication with other health care professionals (when not separately reported), Documenting clinical information in the electronic or other health record, and Care coordination (not separately reported)    Thank you for the opportunity to participate in this patient's care. Please do not hesitate to contact me with any questions/concerns.    Gates Rigg, DNP, APRN, AGNP-C  Advanced Heart Failure APP   [facility]  Collaborating physician: Dr. Bing Matter        Current Medications (including today's revisions)  ??? CHOLEcalciferoL (vitamin D3) (OPTIMAL D3) 50,000 units capsule Take one capsule by mouth every 7 days.   ??? eplerenone (INSPRA) 25 mg tablet Take two tablets by mouth daily.   ??? fluticasone propion/salmeterol (ADVAIR DISKUS IN) Inhale  by mouth into the lungs twice daily. 500/50   ??? isosorbide mononitrate ER (IMDUR) 30 mg tablet,extended release 24 hr Take one-half tablet by mouth every morning.   ??? nitroglycerin (NITROSTAT) 0.4 mg tablet Place one tablet under tongue every 5 minutes as needed for Chest Pain. Max of 3 tablets, call 911.   ??? pravastatin (PRAVACHOL) 20 mg tablet Take one tablet by mouth at bedtime daily.   ??? valsartan (DIOVAN) 40 mg tablet Take one tablet by mouth daily.   ??? YUVAFEM 10 mcg vaginal tablet Insert or Apply  to vaginal area twice weekly.        Patient Instructions   Thank you for coming to [facility]. Your instructions today:    1. Medication changes:  We will send the order for the Farxiga to our Pharmacy here at  so they will work on the Prior Auth for this medication.  Once that is complete they will call you with the cost and it can be shipped to your home.    Once  you start this medication we will need you to get a repeat BMP to check your chemistry levels one week after starting, so please let us know when you start the medication.    2. Labs: We have faxed orders to [facility] for BMP, BNP, and Hgb A1C.    3. Test/Procedures planned: None    4. Please log your weight, blood pressure, and heart rate and bring to all visits.    5. Continue with 2 liter/day or 64 ounces/day fluid restriction and 2 gm (2000 mg) sodium restriction/day    6. Return to clinic:  Follow up with [name] in December via [facility]    Please call the office with any questions or concerns [phone] (nurse triage). Voicemail is available during regular business hours, calls before 4:00 pm should receive a call back the same day.   After 4 pm  and on weekends for emergencies you can reach the ON CALL provider at [phone]      To schedule or change an appointment call [phone].  Fax: [phone]      At the [facility], you and your family are our top priority.  You may receive a survey via email or text message that we are asking you to complete.  We value your feedback to ensure you are satisfied with every visit and we are continually providing the highest quality of patient care.  We know your time is valuable and thank you in advance for completing the survey.    Lab and test results:  As a part of the CARES act, starting 07/28/2019, some results will be released to you via mychart immediately and automatically.  You may see results before your provider sees them; however, your provider will review all these results and then they, or one of their team, will notify you of result information and recommendations.   Critical results will be addressed immediately, but otherwise, please allow [place] time to get back with you prior to you reaching out to [place] for questions.  This will usually take about 72 hours for labs and 5-7 days for procedure test results.      [facility]  Advanced Heart Failure - Silver Team  Kathleen Argue, MD   Bing Matter, MD  Nurse Practitioners: Satira Anis, Fara Boros, Eyvonne Left, and Gates Rigg  RNs: Mick Sell, [place], [name], and Vivien Rota

## 2022-03-03 ENCOUNTER — Encounter: Admit: 2022-03-03 | Discharge: 2022-03-03 | Payer: MEDICARE

## 2022-03-03 ENCOUNTER — Ambulatory Visit: Admit: 2022-03-03 | Discharge: 2022-03-04 | Payer: MEDICARE

## 2022-03-03 DIAGNOSIS — I5032 Chronic diastolic (congestive) heart failure: Secondary | ICD-10-CM

## 2022-03-03 DIAGNOSIS — J45909 Unspecified asthma, uncomplicated: Secondary | ICD-10-CM

## 2022-03-03 DIAGNOSIS — J449 Chronic obstructive pulmonary disease, unspecified: Secondary | ICD-10-CM

## 2022-03-03 DIAGNOSIS — M549 Dorsalgia, unspecified: Secondary | ICD-10-CM

## 2022-03-03 DIAGNOSIS — R7989 Other specified abnormal findings of blood chemistry: Secondary | ICD-10-CM

## 2022-03-03 DIAGNOSIS — K289 Gastrojejunal ulcer, unspecified as acute or chronic, without hemorrhage or perforation: Secondary | ICD-10-CM

## 2022-03-03 DIAGNOSIS — Z131 Encounter for screening for diabetes mellitus: Secondary | ICD-10-CM

## 2022-03-03 DIAGNOSIS — K319 Disease of stomach and duodenum, unspecified: Secondary | ICD-10-CM

## 2022-03-03 DIAGNOSIS — I10 Essential (primary) hypertension: Secondary | ICD-10-CM

## 2022-03-03 DIAGNOSIS — I340 Nonrheumatic mitral (valve) insufficiency: Secondary | ICD-10-CM

## 2022-03-04 ENCOUNTER — Encounter: Admit: 2022-03-04 | Discharge: 2022-03-04 | Payer: MEDICARE

## 2022-03-06 ENCOUNTER — Encounter: Admit: 2022-03-06 | Discharge: 2022-03-06 | Payer: MEDICARE

## 2022-03-06 DIAGNOSIS — I340 Nonrheumatic mitral (valve) insufficiency: Secondary | ICD-10-CM

## 2022-03-06 DIAGNOSIS — I5032 Chronic diastolic (congestive) heart failure: Secondary | ICD-10-CM

## 2022-03-06 LAB — BASIC METABOLIC PANEL
ANION GAP: 7 — ABNORMAL LOW (ref 8–16)
BLD UREA NITROGEN: 16
CALCIUM: 8.8
CHLORIDE: 107
CO2: 24
CREATININE: 0.9
GFR ESTIMATED: 66
GLUCOSE,PANEL: 90
POTASSIUM: 4.1
SODIUM: 138

## 2022-03-06 MED ORDER — JARDIANCE 10 MG PO TAB
5 mg | ORAL_TABLET | Freq: Every day | ORAL | 3 refills | Status: AC
Start: 2022-03-06 — End: ?

## 2022-03-06 NOTE — Telephone Encounter
LVM x1 for pt on preferred line. Notified pt of recommendations by TD, that order for Vania Rea is being placed for PA to [place]. If pt has questions she was instructed to call our nurse line or send [place]. Order placed.

## 2022-03-07 ENCOUNTER — Encounter: Admit: 2022-03-07 | Discharge: 2022-03-07 | Payer: MEDICARE

## 2022-03-07 DIAGNOSIS — I5030 Unspecified diastolic (congestive) heart failure: Secondary | ICD-10-CM

## 2022-03-14 ENCOUNTER — Encounter: Admit: 2022-03-14 | Discharge: 2022-03-14 | Payer: MEDICARE

## 2022-03-14 ENCOUNTER — Ambulatory Visit: Admit: 2022-03-14 | Discharge: 2022-03-14 | Payer: MEDICARE

## 2022-03-14 DIAGNOSIS — K319 Disease of stomach and duodenum, unspecified: Secondary | ICD-10-CM

## 2022-03-14 DIAGNOSIS — M549 Dorsalgia, unspecified: Secondary | ICD-10-CM

## 2022-03-14 DIAGNOSIS — I340 Nonrheumatic mitral (valve) insufficiency: Secondary | ICD-10-CM

## 2022-03-14 DIAGNOSIS — J449 Chronic obstructive pulmonary disease, unspecified: Secondary | ICD-10-CM

## 2022-03-14 DIAGNOSIS — K289 Gastrojejunal ulcer, unspecified as acute or chronic, without hemorrhage or perforation: Secondary | ICD-10-CM

## 2022-03-14 DIAGNOSIS — J45909 Unspecified asthma, uncomplicated: Secondary | ICD-10-CM

## 2022-03-17 ENCOUNTER — Encounter: Admit: 2022-03-17 | Discharge: 2022-03-17 | Payer: MEDICARE

## 2022-03-17 DIAGNOSIS — K319 Disease of stomach and duodenum, unspecified: Secondary | ICD-10-CM

## 2022-03-17 DIAGNOSIS — J449 Chronic obstructive pulmonary disease, unspecified: Secondary | ICD-10-CM

## 2022-03-17 DIAGNOSIS — I340 Nonrheumatic mitral (valve) insufficiency: Secondary | ICD-10-CM

## 2022-03-17 DIAGNOSIS — J45909 Unspecified asthma, uncomplicated: Secondary | ICD-10-CM

## 2022-03-17 DIAGNOSIS — K289 Gastrojejunal ulcer, unspecified as acute or chronic, without hemorrhage or perforation: Secondary | ICD-10-CM

## 2022-03-17 DIAGNOSIS — M549 Dorsalgia, unspecified: Secondary | ICD-10-CM

## 2022-03-17 NOTE — Progress Notes
Date of Service: 03/26/2022      HPI       Patient is 51 y.o. who follows with Dr. Steele Sizer (last seen 11/29/2021). She has a medical history of chronic dyspnea on exertion, hypertension, heart failure with preserved ejection fraction, moderate mitral regurgitation. Etiology of her heart failure which appears to be exercise-induced based on her rise in PA pressures with exercise.  At rest, she had normal filling pressures.  With exercise, her PA pressure increased to 53/25.  On left heart catheterization March 2023 she did not have epicardial coronary artery disease. She has been referred for Alt-flow trial. Not currently ongoing (as of 02/2022).    Echocardiogram 01/21/2022 showed LVEF 65%, probably normal right ventricular cavitary size and probably normal RV systolic function.  Grade 1 LV diastolic dysfunction with normal left atrial pressure.  No left ventricular focal regional wall motion abnormality.  No hemodynamically significant valvular stenosis.  Moderate mitral insufficiency.  This was reviewed by Dr. Steele Sizer who commented that her mitral regurgitation is consistent with previous echocardiograms.    Katelyn Wu was seen today in HF clinic for routine follow up visit via telehealth. She was last seen in clinic on by me 03/03/22. During that office visit, she was started on Jardiance 5 mg po daily when labs after the visit showed stable creatinine. MR on most recent echo reviewed with Dr. Sherryll Burger and referral to valve team placed.     She was seen by Dr. Helen Hashimoto 03/14/2022 for eval of MV regurgitation.  Pending dental clearance, planned for right foot cottony, mitral valve repair versus mechanical replacement 04/17/2022.    Today, she reported starting the Jardiance after her last visit made her feel terrible.  She had less energy, headache, itching.  She took it consistently for 2 weeks and stopped either Saturday or Sunday.  She has felt better after stopping the Jardiance.  She continues to take the valsartan 40 mg daily.  She said that she has been compliant with taking it, however sometimes she takes 1/2 pill or a whole pill depending on how she feels.  Her weight at home is up about 10 pounds from previous dry weight of 170.  She also noted worsening cough, shortness of breath on exertion, pitting edema.  Her blood pressure at home this morning was 124/80, heart rate 102.  She drinks about 64 ounces of fluid per day and follows low-sodium diet.  Discussed plan for upcoming mitral valve repair and she is very hopeful that this will help her symptoms.         Vitals:    03/26/22 0757   O2 Device: None (Room air)     There is no height or weight on file to calculate BMI.    Physical exam limited d/t video tele health visit:  General Appearance: patient in no apparent distress.  Skin: no jaundice, exposed skin is intact  Eyes: conjunctivae and lids normal, pupils are equal and round   Lips: no pallor or cyanosis   Neck Veins: VESSELS: JVP difficult to appreciate    Respiratory Effort: breathing comfortably, no respiratory distress   Lower extremities: patient reports pitting lower extremity edema. Unable to assess appreciate clearly via Telehealth  Orientation: oriented to time, place and person   PSYCH: Appropriate   Language and Memory: patient responsive and seems to comprehend information   NEURO: Alert and conversant    Cardiovascular Studies  Echocardiogram 01/21/2022  Interpretation Summary  1. Normal left ventricular  cavitary dimensions with mild eccentric hypertrophy  2. Normal left ventricular systolic function with a visually estimated ejection fraction of ~65%.  3. Probably normal right ventricular cavitary size with probably normal RV systolic function.  4. Mild/grade 1 LV diastolic dysfunction with normal left atrial pressure  5. No left ventricular focal regional wall motion abnormality.  6. No chamber enlargement.  7. No hemodynamically significant valvular stenosis   8. Moderate, eccentric, posteriorly directed mitral insufficiency.  No significant mitral stenosis: Mean gradient 2 mmHg at 73 bpm.  The severely eccentric jet may underestimate the underlying severity of MR.  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 Estimated PA systolic pressure is 24 mmHg plus CVP: IVC not well-visualized.  10. No pericardial effusion.  ?  Direct visual comparison was made with a prior transthoracic echocardiogram dated August 16, 2021.  Both studies demonstrate similar biventricular function and wall motion pattern.  Extent of MR was moderate on the previous study, unchanged.  Consider transesophageal echocardiography for better assessment of the severity of MR if clinically indicated  ?  Echocardiogram 08/16/2021  Interpretation Summary  11. LV size is normal and overall systolic function also appears to be within normal limits.  EF is about 55%.  12. Normal diastolic function   13. Normal right ventricular size and systolic function  14. Normal biatrial size  15. Normal central venous pressure  16. 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  There is a very eccentric posteriorly directed jet of likely moderate mitral regurgitation.  No stenosis  17. Other valves without structural or functional abnormalities  18. Unable to assess pulmonary artery systolic pressure  19. No pericardial effusion  ?  No prior studies available in our system.  The eccentric mitral valve jet does not appear to be severe but if clinically there is a concern then a transesophageal echocardiogram could be performed    Left and right heart catheterization 07/15/2021  BASELINE HEMODYNAMICS:    1. Blood pressure 138/84, mean 105 mmHg.  2. Heart rate 66 beats per minute.  3. Body surface area 1.9 sq m.  4. Hemoglobin percentage 13.4 g/dL.  ?  SATURATIONS:    1. Aorta saturation 96%.  2. RA saturation 63%.  3. PA saturation 65%.  ?  PRESSURES:    1. Right atrial pressure is 5 mmHg.  2. Right ventricular pressure 31/1, CVP 7 mmHg.  3. PA pressure 31/11, mean 18 mmHg.  4. Pulmonary capillary wedge pressure 11 mmHg.  5. Transpulmonic gradient 7 mmHg.  6. Cardiac output by thermodilution technique was 4 L/minute with an index of 2.1 L/minute per sq m.  7. Cardiac output by Fick method was also 4 L/minute with an index of 2.1 L/minute per sq m.  ?  FLUID BOLUS CHALLENGE:  500 mL was administered intraprocedurally followed by repeat hemodynamics which are as follows:  1. PA saturation 67%.  2. PA pressure 34/14, mean 21 mmHg.  3. Cardiac output by thermodilution technique was 4.6 L/minute with an index of 2.4 L/minute per sq m.  4. Cardiac output by Fick method was 3.1 L/minute with an index of 1.6 L/minute per sq m.   ?  The patient allowed to exercise for a total of 5 minutes on the table with arm raises and subsequently after 750 mL of fluid bolus, we repeated the hemodynamics, which are as follows.  1. PA saturation was 59%.  2. PA pressure  was 53/25, mean 35 mmHg.  3. Cardiac output by thermodilution technique was 5 L/minute with an index of 2.6 L/minute per sq m.  4. Cardiac output by Fick method was 2.6 L/minute with an index of 1.4 L/minute per sq m, which is indicative of exercise-induced diastolic dysfunction.  ?  LEFT HEART CATHETERIZATION:  Tiger catheter was used for LV pressure assessment, selective right and left native coronary angiography.  ?  LEFT VENTRICULAR HEMODYNAMICS:    1. LV systolic blood pressure was 126 mmHg.  2. LVEDP was 10 mmHg.  There was no gradient across the aortic valve on catheter pullback.  ?  DETAILS OF CORONARY ANGIOGRAM:    1. Left main:  The left main is a short vessel with no significant angiographic disease along its entire course.  This is a large caliber vessel, which bifurcates into a left anterior descending artery and a left circumflex artery.    2. Left anterior descending artery.  Large type 3 configuration.  Proximal to distal portion, there is minor plaquing, but no significant obstructive disease.  It gives rise to 2 small diagonal branches with minor plaquing, but no significant focal obstructive disease is noted.  3. Left circumflex artery.  In the proximal and distal portions, it is a large caliber vessel with minor plaquing.  It gives rise to 2 large obtuse marginal vessels, both of which have minor plaquing, but no significant obstructive disease is noted.  4. Right coronary artery:  The right coronary artery is a large dominant vessel.  Proximal and distal portions have minor plaquing.  It bifurcates at a high level to a PDA and a PLV.  Both of these are moderate caliber vessels with minor plaquing, but no significant focal obstructive stenosis is noted.    FINAL IMPRESSION:    1. Patent epicardial coronary arteries with normal TIMI-3 flow and no significant obstructive coronary artery disease.  2. Normal left ventricular end-diastolic pressure without aortic stenosis at baseline.  3. Normal filling pressures at baseline with significant worsening of pulmonary artery pressure to 35 mmHg with a reduction in pulmonary artery saturation indicative of exercise-induced diastolic dysfunction.  4. Advanced Heart Failure/Transplant team referral.  ?  Cardiovascular Health Factors  Vitals BP Readings from Last 3 Encounters:   03/14/22 120/72   02/07/22 115/70   01/21/22 126/80     Wt Readings from Last 3 Encounters:   03/14/22 81.6 kg (180 lb)   02/07/22 77.1 kg (170 lb)   01/21/22 80.7 kg (178 lb)     BMI Readings from Last 3 Encounters:   03/14/22 28.19 kg/m?   02/07/22 26.63 kg/m?   01/21/22 27.88 kg/m?      Smoking Social History     Tobacco Use   Smoking Status Never   Smokeless Tobacco Never      Lipid Profile Cholesterol   Date Value Ref Range Status   10/18/2020 215  Final     HDL   Date Value Ref Range Status   10/18/2020 70  Final     LDL   Date Value Ref Range Status   10/18/2020 132  Final     Triglycerides   Date Value Ref Range Status 10/18/2020 65  Final      Blood Sugar Hemoglobin A1C   Date Value Ref Range Status   03/06/2022 5.6  Final     Glucose   Date Value Ref Range Status   03/06/2022 90  Final   10/31/2021 91  Final  07/19/2021 84 70 - 100 MG/DL Final          Problems Addressed Today  Encounter Diagnoses   Name Primary?   ? Chronic diastolic heart failure (HCC) Yes   ? Primary hypertension    ? Chronic heart failure with preserved ejection fraction (HCC)    ? Moderate mitral regurgitation               Assessment/Plan:    Exercise-induced HFpEF   -Cardiac MRI showed no significant LGE that suggest an infiltrative process.  At rest, she has normal filling pressures.  With exercise, her PA pressures increased to 53/25.  -She has been referred for evaluation of Alt-Flow 2 trial when active  - NYHA Functional Class III, AHA stage C (structural heart disease with prior or current symptoms of HF) symptoms.    - Estimated dry weight: 170 lb; reported home weight today 180 lb  - Patient appears hypervolemic today  BNP Labs:   Lab Results   Component Value Date    NTPROBNP 197 (H) 03/06/2022      Diuretics  Current Dose Changes   None  11/29: Lasix 20 mg daily for next 3 days then take as needed for weight gain 3 pounds in 1 day or 5 pounds in 1 week or lower extremity swelling     GDMT Current Dose Changes   ACEI/ARB/ARNI  valsartan 40 mg daily    Aldosterone Antagonist  *patient stopped taking eplerenone 50 mg daily ~September 2023 2/2 side effects     SGLT-2 Inhibitor  Jardiance 5 mg daily  11/29: Patient stopped taking due to side effects.  Removed from med list     > In discussing symptoms and home weights with patient, concern for volume overload with weight up 10 pounds, worsening cough, DOE, pitting edema.  She is not on loop diuretic currently.  Will start Lasix 20 mg daily for 3 days then as needed as above. She is agreeable to this plan.  - Follow-up: with T. Francis Yardley 04/15/22 via Telehealth    CAD Surveillance  - last ischemic evaluation: Left heart catheterization March 2023 showed no significant obstructive coronary artery disease (full report above)  - denies anginal symptoms    Mitral regurgitation- moderate  > 11/7: Discussed TEE findings with Dr. Steele Sizer who agreed with referral to our structural team for further evaluation of potential intervention for mitral valve. Orders placed.  > She was seen by Dr. Helen Hashimoto 03/14/2022.  Pending dental clearance, planned for mitral valve repair versus mechanical replacement 04/17/2022.    Hypertension  - continue medications as noted above  - controlled; BP today 124/80    Renal Surveillance  - recent creatinine trends <1.0  - last creatinine:   Lab Results   Component Value Date/Time    CR 0.94 03/06/2022 12:00 AM     Follow up  Appointment scheduled to return to clinic in 04/15/22 with T. Alezander Dimaano, APRN via telehealth. She is in agreement and states understanding of this plan. She was encouraged to reach out to our clinic with any questions, concerns, or change in condition. Please see AVS for full patient education.          Total Time Today was 35 minutes in the following activities: Preparing to see the patient, Obtaining and/or reviewing separately obtained history, Performing a medically appropriate examination and/or evaluation, Counseling and educating the patient/family/caregiver, Ordering medications, tests, or procedures as appropriate, Referring and communication with other health care professionals (when  not separately reported), Documenting clinical information in the electronic or other health record, and Care coordination (not separately reported)    Thank you for the opportunity to participate in this patient's care. Please do not hesitate to contact me with any questions/concerns.    Gates Rigg, DNP, APRN, AGNP-C  Advanced Heart Failure APP   [facility]  Collaborating physician: Dr. Bing Matter    Current Medications (including today's revisions)  ? CHOLEcalciferoL (vitamin D3) (OPTIMAL D3) 50,000 units capsule Take one capsule by mouth every 7 days.   ? fluticasone propion/salmeterol (ADVAIR DISKUS IN) Inhale  by mouth into the lungs twice daily. 500/50   ? furosemide (LASIX) 20 mg tablet Take one tablet by mouth as Needed (as needed daily with weight gain of 3 pounds in one day, 5 pounds in one week or with ankle swelling).   ? nitroglycerin (NITROSTAT) 0.4 mg tablet Place one tablet under tongue every 5 minutes as needed for Chest Pain. Max of 3 tablets, call 911.   ? valsartan (DIOVAN) 40 mg tablet Take one tablet by mouth daily.   ? YUVAFEM 10 mcg vaginal tablet Insert or Apply  to vaginal area twice weekly.        Patient Instructions   Thank you for coming to [facility]. Your instructions today:    1. Medication changes:    a. Lasix 20 mg daily for the next three days then take as needed with weight gain of 3 lbs in one day or 5 lbs in one week or with ankle swelling    2. Labs: none today    3. Please log your weight, blood pressure, and heart rate and bring to all visits.    4. Continue with 2 liter/day or 64 ounces/day fluid restriction and 2 gm (2000 mg) sodium restriction/day    5. Return to clinic:  with Grafton Folk, NP 04/15/22 at 08:30 am via Telehealth    Please call the office with any questions or concerns [phone] (nurse triage). Voicemail is available during regular business hours, calls before 4:00 pm should receive a call back the same day.   After 4 pm and on weekends for emergencies you can reach the ON CALL provider at [phone]      To schedule or change an appointment call [phone].  Fax: [phone]      At the [facility], you and your family are our top priority.  You may receive a survey via email or text message that we are asking you to complete.  We value your feedback to ensure you are satisfied with every visit and we are continually providing the highest quality of patient care.  We know your time is valuable and thank you in advance for completing the survey.    Lab and test results:  As a part of the CARES act, starting 07/28/2019, some results will be released to you via mychart immediately and automatically.  You may see results before your provider sees them; however, your provider will review all these results and then they, or one of their team, will notify you of result information and recommendations.   Critical results will be addressed immediately, but otherwise, please allow [place] time to get back with you prior to you reaching out to [place] for questions.  This will usually take about 72 hours for labs and 5-7 days for procedure test results.      [facility] at [facility]  Advanced Heart Failure - Silver Team  Kathleen Argue, MD   Bing Matter, MD  Nurse Practitioners: Satira Anis, Fara Boros, Eyvonne Left, and Gates Rigg  RNs: Mick Sell, [place], [name], and Vivien Rota

## 2022-03-25 ENCOUNTER — Inpatient Hospital Stay: Admit: 2022-03-25 | Discharge: 2022-03-25 | Payer: MEDICARE

## 2022-03-25 DIAGNOSIS — I340 Nonrheumatic mitral (valve) insufficiency: Secondary | ICD-10-CM

## 2022-03-26 ENCOUNTER — Encounter: Admit: 2022-03-26 | Discharge: 2022-03-26 | Payer: MEDICARE

## 2022-03-26 ENCOUNTER — Ambulatory Visit: Admit: 2022-03-26 | Discharge: 2022-03-27 | Payer: MEDICARE

## 2022-03-26 DIAGNOSIS — J45909 Unspecified asthma, uncomplicated: Secondary | ICD-10-CM

## 2022-03-26 DIAGNOSIS — I340 Nonrheumatic mitral (valve) insufficiency: Secondary | ICD-10-CM

## 2022-03-26 DIAGNOSIS — J449 Chronic obstructive pulmonary disease, unspecified: Secondary | ICD-10-CM

## 2022-03-26 DIAGNOSIS — M549 Dorsalgia, unspecified: Secondary | ICD-10-CM

## 2022-03-26 DIAGNOSIS — I10 Essential (primary) hypertension: Secondary | ICD-10-CM

## 2022-03-26 DIAGNOSIS — I5032 Chronic diastolic (congestive) heart failure: Secondary | ICD-10-CM

## 2022-03-26 DIAGNOSIS — K319 Disease of stomach and duodenum, unspecified: Secondary | ICD-10-CM

## 2022-03-26 DIAGNOSIS — K289 Gastrojejunal ulcer, unspecified as acute or chronic, without hemorrhage or perforation: Secondary | ICD-10-CM

## 2022-03-26 MED ORDER — FUROSEMIDE 20 MG PO TAB
20 mg | ORAL_TABLET | ORAL | 2 refills | 90.00000 days | Status: AC | PRN
Start: 2022-03-26 — End: ?

## 2022-03-26 NOTE — Telephone Encounter
Called and LMOM of pt's non-secure line updating her to review AVS instructions from today's telehealth appt with Helene Kelp, NP.    Return call if questions.

## 2022-04-08 ENCOUNTER — Encounter: Admit: 2022-04-08 | Discharge: 2022-04-08 | Payer: MEDICARE

## 2022-04-08 DIAGNOSIS — I340 Nonrheumatic mitral (valve) insufficiency: Secondary | ICD-10-CM

## 2022-04-08 MED ORDER — LIDOCAINE (PF) 10 MG/ML (1 %) IJ SOLN
.2 mL | INTRAMUSCULAR | 0 refills | PRN
Start: 2022-04-08 — End: ?

## 2022-04-08 MED ORDER — SODIUM CHLORIDE 0.9 % IV SOLP
INTRAVENOUS | 0 refills
Start: 2022-04-08 — End: ?

## 2022-04-11 ENCOUNTER — Encounter: Admit: 2022-04-11 | Discharge: 2022-04-11 | Payer: MEDICARE

## 2022-04-11 NOTE — Progress Notes
Date of Service: 04/15/2022      HPI       Patient is 51 y.o. who follows with Dr. Steele Sizer (last seen 11/29/2021). She has a medical history of chronic dyspnea on exertion, hypertension, heart failure with preserved ejection fraction, moderate mitral regurgitation. Etiology of her heart failure which appears to be exercise-induced based on her rise in PA pressures with exercise.  At rest, she had normal filling pressures.  With exercise, her PA pressure increased to 53/25.  On left heart catheterization March 2023 she did not have epicardial coronary artery disease. She has been referred for Alt-flow trial- enrollment TBD.    Echocardiogram 01/21/2022 showed LVEF 65%, probably normal right ventricular cavitary size and probably normal RV systolic function.  Grade 1 LV diastolic dysfunction with normal left atrial pressure.  No left ventricular focal regional wall motion abnormality.  No hemodynamically significant valvular stenosis.  Moderate mitral insufficiency.  This was reviewed by Dr. Steele Sizer who commented that her mitral regurgitation is consistent with previous echocardiograms.    Katelyn Wu was seen today in HF clinic for routine follow up visit via telehealth. She was last seen in clinic on by me 03/26/22. During that office visit, she was started on Lasix 20 mg daily for 3 days then as needed.    She was seen by Dr. Helen Hashimoto 03/14/2022 for eval of MV regurgitation.  Pending dental clearance, planned for mitral valve repair versus mechanical replacement 04/17/2022.    Today, she reports feeling overall the same.  She continues to have shortness of breath on exertion that is worse at night.  She still feels exhausted at the end of the day with doing minimal activities.  She did take the Lasix 20 mg daily for 3 days but it made her feel like crap and feel very dizzy so she has not taken any more doses since.  She had no change in her weight but did notice a slight increase in the amount of urine output.  She has swelling in her legs that is nothing to get excited about.  In discussing it, she described it as nonpitting.  She is looking forward to undergoing mitral valve repair and is hopeful that it will make her symptoms better.  Her blood pressure at home has been 120s to 130s over 70s-80s, heart rate in the 80s.  Weight is around 180 pounds. Medication reconciliation performed. She is taking medications as directed.          Vitals:    04/15/22 0756   O2 Device: None (Room air)   PainSc: Zero       There is no height or weight on file to calculate BMI.    Physical exam limited d/t video tele health visit:  General Appearance: patient in no apparent distress.  Skin: no jaundice, exposed skin is intact  Eyes: conjunctivae and lids normal, pupils are equal and round   Lips: no pallor or cyanosis   Neck Veins: VESSELS: JVP difficult to appreciate    Respiratory Effort: breathing comfortably, no respiratory distress   Lower extremities: patient reports pitting lower extremity edema. Unable to assess appreciate clearly via Telehealth  Orientation: oriented to time, place and person   PSYCH: Appropriate   Language and Memory: patient responsive and seems to comprehend information   NEURO: Alert and conversant    Cardiovascular Studies  Echocardiogram 01/21/2022  Interpretation Summary  Normal left ventricular cavitary dimensions with mild eccentric hypertrophy  Normal left ventricular  systolic function with a visually estimated ejection fraction of ~65%.  Probably normal right ventricular cavitary size with probably normal RV systolic function.  Mild/grade 1 LV diastolic dysfunction with normal left atrial pressure  No left ventricular focal regional wall motion abnormality.  No chamber enlargement.  No hemodynamically significant valvular stenosis   Moderate, eccentric, posteriorly directed mitral insufficiency.  No significant mitral stenosis: Mean gradient 2 mmHg at 73 bpm.  The severely eccentric jet may underestimate the underlying severity of MR.  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  No pericardial effusion.     Direct visual comparison was made with a prior transthoracic echocardiogram dated August 16, 2021.  Both studies demonstrate similar biventricular function and wall motion pattern.  Extent of MR was moderate on the previous study, unchanged.  Consider transesophageal echocardiography for better assessment of the severity of MR if clinically indicated     Echocardiogram 08/16/2021  Interpretation Summary  LV size is normal and overall systolic function also appears to be within normal limits.  EF is about 55%.  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  There is a very eccentric posteriorly directed jet of likely moderate mitral regurgitation.  No stenosis  Other valves without structural or functional abnormalities  Unable to assess pulmonary artery systolic pressure  No pericardial effusion     No prior studies available in our system.  The eccentric mitral valve jet does not appear to be severe but if clinically there is a concern then a transesophageal echocardiogram could be performed    Left and right heart catheterization 07/15/2021  BASELINE HEMODYNAMICS:    Blood pressure 138/84, mean 105 mmHg.  Heart rate 66 beats per minute.  Body surface area 1.9 sq m.  Hemoglobin percentage 13.4 g/dL.     SATURATIONS:    Aorta saturation 96%.  RA saturation 63%.  PA saturation 65%.     PRESSURES:    Right atrial pressure is 5 mmHg.  Right ventricular pressure 31/1, CVP 7 mmHg.  PA pressure 31/11, mean 18 mmHg.  Pulmonary capillary wedge pressure 11 mmHg.  Transpulmonic gradient 7 mmHg.  Cardiac output by thermodilution technique was 4 L/minute with an index of 2.1 L/minute per sq m.  Cardiac output by Fick method was also 4 L/minute with an index of 2.1 L/minute per sq m.     FLUID BOLUS CHALLENGE:  500 mL was administered intraprocedurally followed by repeat hemodynamics which are as follows:  PA saturation 67%.  PA pressure 34/14, mean 21 mmHg.  Cardiac output by thermodilution technique was 4.6 L/minute with an index of 2.4 L/minute per sq m.  Cardiac output by Fick method was 3.1 L/minute with an index of 1.6 L/minute per sq m.      The patient allowed to exercise for a total of 5 minutes on the table with arm raises and subsequently after 750 mL of fluid bolus, we repeated the hemodynamics, which are as follows.  PA saturation was 59%.  PA pressure was 53/25, mean 35 mmHg.  Cardiac output by thermodilution technique was 5 L/minute with an index of 2.6 L/minute per sq m.  Cardiac output by Fick method was 2.6 L/minute with an index of 1.4 L/minute per sq m,  which is indicative of exercise-induced diastolic dysfunction.     LEFT HEART CATHETERIZATION:  Tiger catheter was used for LV pressure assessment, selective right and left native coronary angiography.     LEFT VENTRICULAR HEMODYNAMICS:    LV systolic blood pressure was 126 mmHg.  LVEDP was 10 mmHg.  There was no gradient across the aortic valve on catheter pullback.     DETAILS OF CORONARY ANGIOGRAM:    Left main:  The left main is a short vessel with no significant angiographic disease along its entire course.  This is a large caliber vessel, which bifurcates into a left anterior descending artery and a left circumflex artery.    Left anterior descending artery.  Large type 3 configuration.  Proximal to distal portion, there is minor plaquing, but no significant obstructive disease.  It gives rise to 2 small diagonal branches with minor plaquing, but no significant focal obstructive disease is noted.  Left circumflex artery.  In the proximal and distal portions, it is a large caliber vessel with minor plaquing.  It gives rise to 2 large obtuse marginal vessels, both of which have minor plaquing, but no significant obstructive disease is noted.  Right coronary artery:  The right coronary artery is a large dominant vessel.  Proximal and distal portions have minor plaquing.  It bifurcates at a high level to a PDA and a PLV.  Both of these are moderate caliber vessels with minor plaquing, but no significant focal obstructive stenosis is noted.    FINAL IMPRESSION:    Patent epicardial coronary arteries with normal TIMI-3 flow and no significant obstructive coronary artery disease.  Normal left ventricular end-diastolic pressure without aortic stenosis at baseline.  Normal filling pressures at baseline with significant worsening of pulmonary artery pressure to 35 mmHg with a reduction in pulmonary artery saturation indicative of exercise-induced diastolic dysfunction.  Advanced Heart Failure/Transplant team referral.     Cardiovascular Health Factors  Vitals BP Readings from Last 3 Encounters:   03/14/22 120/72   02/07/22 115/70   01/21/22 126/80     Wt Readings from Last 3 Encounters:   03/14/22 81.6 kg (180 lb)   02/07/22 77.1 kg (170 lb)   01/21/22 80.7 kg (178 lb)     BMI Readings from Last 3 Encounters:   03/14/22 28.19 kg/m?   02/07/22 26.63 kg/m?   01/21/22 27.88 kg/m?      Smoking Social History     Tobacco Use   Smoking Status Never   Smokeless Tobacco Never      Lipid Profile Cholesterol   Date Value Ref Range Status   10/18/2020 215  Final     HDL   Date Value Ref Range Status   10/18/2020 70  Final     LDL   Date Value Ref Range Status   10/18/2020 132  Final     Triglycerides   Date Value Ref Range Status   10/18/2020 65  Final      Blood Sugar Hemoglobin A1C   Date Value Ref Range Status   03/06/2022 5.6  Final     Glucose   Date Value Ref Range Status   03/06/2022 90  Final   10/31/2021 91  Final   07/19/2021 84 70 - 100 MG/DL Final          Problems Addressed Today  Encounter Diagnoses   Name Primary?    Chronic heart failure with preserved ejection fraction (HCC) Yes    Primary hypertension  Mitral valve insufficiency, unspecified etiology           Assessment/Plan:    Exercise-induced HFpEF   -Cardiac MRI showed no significant LGE that suggest an infiltrative process.  At rest, she has normal filling pressures.  With exercise, her PA pressures increased to 53/25.  -She has been referred for evaluation of Alt-Flow 2 trial   - NYHA Functional Class III, AHA stage C (structural heart disease with prior or current symptoms of HF) symptoms.    - Estimated dry weight: previously ~170 lb; reported home weight today 182 lb    BNP Labs:   Lab Results   Component Value Date    NTPROBNP 197 (H) 03/06/2022      Diuretics  Current Dose Changes   Lasix 20 mg daily as needed for weight gain 3 pounds in 1 day or 5 pounds in 1 week or lower extremity swelling       GDMT Current Dose Changes   ACEI/ARB/ARNI  valsartan 40 mg daily    Aldosterone Antagonist  *patient stopped taking eplerenone 50 mg daily ~September 2023 2/2 side effects     SGLT-2 Inhibitor  Jardiance 5 mg daily- patient stopped 11/29 due to side effects. She does not want to restart this medication.       > Her weight is still elevated around 180 pounds.  She took the Lasix as prescribed after the last visit for 3 days but it made her feel bad and made her think she does not have very much fluid on board.  Physical exam limited given telehealth and challenging to appreciate volume status.  She describes nonpitting lower extremity edema that is unchanged, shortness of breath with exertion at baseline which is unchanged.  Continue Lasix 20 mg daily as needed as above.  - Follow-up: In 3 months with T. Audiel Scheiber, APP    CAD Surveillance  - last ischemic evaluation: Left heart catheterization March 2023 showed no significant obstructive coronary artery disease (full report above)  - denies anginal symptoms    Mitral regurgitation- moderate  > 11/7: Discussed TEE findings with Dr. Steele Sizer who agreed with referral to our structural team for further evaluation of potential intervention for mitral valve. Orders placed.  > She was seen by Dr. Helen Hashimoto 03/14/2022. Planned for mitral valve repair versus mechanical replacement 04/17/2022.    Hypertension  - continue medications as noted above  - controlled; BP today 130/87    Renal Surveillance  - recent creatinine trends <1.0  - last creatinine:   Lab Results   Component Value Date/Time    CR 0.94 03/06/2022 12:00 AM     Follow up  Appointment scheduled to return to clinic in 3 months via telehealth. She is in agreement and states understanding of this plan. She was encouraged to reach out to our clinic with any questions, concerns, or change in condition. Please see AVS for full patient education.          Total Time Today was 30 minutes in the following activities: Preparing to see the patient, Obtaining and/or reviewing separately obtained history, Performing a medically appropriate examination and/or evaluation, Counseling and educating the patient/family/caregiver, Ordering medications, tests, or procedures as appropriate, Referring and communication with other health care professionals (when not separately reported), Documenting clinical information in the electronic or other health record, and Care coordination (not separately reported)    Thank you for the opportunity to participate in this patient's care. Please do not hesitate to contact me  with any questions/concerns.    Gates Rigg, DNP, APRN, AGNP-C  Advanced Heart Failure APP   [facility]  Collaborating physician: Dr. Bing Matter    Current Medications (including today's revisions)   CHOLEcalciferoL (vitamin D3) (OPTIMAL D3) 50,000 units capsule Take one capsule by mouth every 7 days.    fluticasone propion/salmeterol (ADVAIR DISKUS IN) Inhale  by mouth into the lungs twice daily. 500/50 furosemide (LASIX) 20 mg tablet Take one tablet by mouth as Needed (as needed daily with weight gain of 3 pounds in one day, 5 pounds in one week or with ankle swelling).    nitroglycerin (NITROSTAT) 0.4 mg tablet Place one tablet under tongue every 5 minutes as needed for Chest Pain. Max of 3 tablets, call 911.    valsartan (DIOVAN) 40 mg tablet Take one tablet by mouth daily.        Patient Instructions   Thank you for coming to [facility]. Your instructions today:    Medication changes:  none today    Labs: as scheduled for pre-op    Please log your weight, blood pressure, and heart rate and bring to all visits.    Continue with 2 liter/day or 64 ounces/day fluid restriction and 2 gm (2000 mg) sodium restriction/day    Return to clinic:  in three months with Grafton Folk, APRN    Please call the office with any questions or concerns [phone] (nurse triage). Voicemail is available during regular business hours, calls before 4:00 pm should receive a call back the same day.   After 4 pm and on weekends for emergencies you can reach the ON CALL provider at [phone]      To schedule or change an appointment call [phone].  Fax: [phone]    [facility]  Advanced Heart Failure - Silver Team    Bing Matter, MD  Nurse Practitioners: Satira Anis, Fara Boros, Eyvonne Left, and Gates Rigg  RNs: Mick Sell, [place], [name], and Vivien Rota    At the [facility], you and your family are our top priority.  You may receive a survey via email or text message that we are asking you to complete.  We value your feedback to ensure you are satisfied with every visit and we are continually providing the highest quality of patient care.  We know your time is valuable and thank you in advance for completing the survey.    Lab and test results:  As a part of the CARES act, starting 07/28/2019, some results will be released to you via mychart immediately and automatically.  You may see results before your provider sees them; however, your provider will review all these results and then they, or one of their team, will notify you of result information and recommendations.   Critical results will be addressed immediately, but otherwise, please allow [place] time to get back with you prior to you reaching out to [place] for questions.  This will usually take about 72 hours for labs and 5-7 days for procedure test results.

## 2022-04-15 ENCOUNTER — Ambulatory Visit: Admit: 2022-04-15 | Discharge: 2022-04-16 | Payer: MEDICARE

## 2022-04-15 ENCOUNTER — Encounter: Admit: 2022-04-15 | Discharge: 2022-04-15 | Payer: MEDICARE

## 2022-04-15 DIAGNOSIS — I5032 Chronic diastolic (congestive) heart failure: Secondary | ICD-10-CM

## 2022-04-15 DIAGNOSIS — K289 Gastrojejunal ulcer, unspecified as acute or chronic, without hemorrhage or perforation: Secondary | ICD-10-CM

## 2022-04-15 DIAGNOSIS — M549 Dorsalgia, unspecified: Secondary | ICD-10-CM

## 2022-04-15 DIAGNOSIS — I10 Essential (primary) hypertension: Secondary | ICD-10-CM

## 2022-04-15 DIAGNOSIS — J45909 Unspecified asthma, uncomplicated: Secondary | ICD-10-CM

## 2022-04-15 DIAGNOSIS — J449 Chronic obstructive pulmonary disease, unspecified: Secondary | ICD-10-CM

## 2022-04-15 DIAGNOSIS — I340 Nonrheumatic mitral (valve) insufficiency: Secondary | ICD-10-CM

## 2022-04-15 DIAGNOSIS — K319 Disease of stomach and duodenum, unspecified: Secondary | ICD-10-CM

## 2022-04-15 NOTE — Progress Notes
RN called patient to inform need of dental clearance form needs to be complete and brought to appointment 12/20. Unable to contact, both numbers in patients chart are disconnected.

## 2022-04-16 ENCOUNTER — Encounter: Admit: 2022-04-16 | Discharge: 2022-04-16 | Payer: No Typology Code available for payment source

## 2022-04-16 ENCOUNTER — Ambulatory Visit: Admit: 2022-04-16 | Discharge: 2022-04-16 | Payer: No Typology Code available for payment source

## 2022-04-16 ENCOUNTER — Encounter: Admit: 2022-04-16 | Discharge: 2022-04-16 | Payer: MEDICARE

## 2022-04-16 DIAGNOSIS — K319 Disease of stomach and duodenum, unspecified: Secondary | ICD-10-CM

## 2022-04-16 DIAGNOSIS — I340 Nonrheumatic mitral (valve) insufficiency: Secondary | ICD-10-CM

## 2022-04-16 DIAGNOSIS — K289 Gastrojejunal ulcer, unspecified as acute or chronic, without hemorrhage or perforation: Secondary | ICD-10-CM

## 2022-04-16 DIAGNOSIS — J45909 Unspecified asthma, uncomplicated: Secondary | ICD-10-CM

## 2022-04-16 DIAGNOSIS — J449 Chronic obstructive pulmonary disease, unspecified: Secondary | ICD-10-CM

## 2022-04-16 DIAGNOSIS — M549 Dorsalgia, unspecified: Secondary | ICD-10-CM

## 2022-04-16 LAB — COMPREHENSIVE METABOLIC PANEL
ANION GAP: 7 (ref 3–12)
AST: 13 U/L (ref 7–40)
BLD UREA NITROGEN: 13 mg/dL (ref 7–25)
CALCIUM: 9.7 mg/dL (ref 8.5–10.6)
CHLORIDE: 107 MMOL/L (ref 98–110)
CO2: 28 MMOL/L (ref 21–30)
CREATININE: 0.9 mg/dL (ref 0.4–1.00)
GLUCOSE,PANEL: 87 mg/dL (ref 70–100)
POTASSIUM: 4 MMOL/L (ref 3.5–5.1)
SODIUM: 142 MMOL/L (ref 137–147)
TOTAL BILIRUBIN: 0.5 mg/dL (ref 0.3–1.2)
TOTAL PROTEIN: 7.4 g/dL (ref 6.0–8.0)

## 2022-04-16 LAB — URINALYSIS MICROSCOPIC REFLEX TO CULTURE

## 2022-04-16 LAB — PTT (APTT): PTT: 29 s (ref 24.0–36.5)

## 2022-04-16 LAB — CBC
RBC COUNT: 4.4 M/UL (ref 4.0–5.0)
WBC COUNT: 6.5 K/UL (ref 4.5–11.0)

## 2022-04-16 LAB — PROTIME INR (PT): PROTIME: 11 s (ref 9.5–14.2)

## 2022-04-16 NOTE — Progress Notes
Received patient's dental clearance for upcoming procedure. Scanned and sent to [facility] medical records. MP

## 2022-04-16 NOTE — Progress Notes
Pre-op education appt complete with Tomasa Hose. No family or support person here with her for preop appt, but patient states that  her friend, Clarise Cruz; who will be w/ pt on DOS.  Informed of current visitor policy. Reviewed and provided written pre-op instructions.  Instructed pt to be NPO at 2300 the noc before surgery, except for a sip of water to take any meds that the [facility] pharmacist instructs pt to take on the morning of surgery.  Verified that  LD of valsartan (Diovan) was 12/18, as instructed.  As per written instructions, pt to take 2 preop showers w/ provided CHG 4% soap prior to coming to the hospital for surgery. Instructed to check in at [facility] Admissions at [place] on DOS. Discussed process for pre-op, intra-op and Emannuel Vise-op recovery.  Discussed Brittin Janik-op pain; lifting and driving restrictions; importance of mobilization and s/s of infection after discharge.  Instructed to call the [place] office if having any health changes prior to surgery.  Pt verbalized understanding of all instructions.  Denies skin or dental issues; denies s/s UTI.     Escorted pt to radiology for CXR.  All questions answered to the pt's satisfaction.  Escorted to [facility] Admitting for pre-registration and to [facility].  Preop labs drawn/preop EKG to be completed in [facility].     MP

## 2022-04-16 NOTE — Unmapped
[facility] Anesthesia Pre-Procedure Evaluation    Name: Katelyn Wu      MRN: [id number]     DOB: Aug 19, 1970     Age: 51 y.o.     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)      Left Atrial Appendage Ligation (Right)    Location: CVOR 4 / CVOR    Providers: Lenise Herald, MD            Physical Assessment  Vital Signs (last filed in past 24 hours):  BP: 135/74 (12/20 0857)  Temp: 36.3 ?C (97.4 ?F) (12/20 4540)  Pulse: 70 (12/20 0855)  Respirations: 16 PER MINUTE (12/20 0855)  SpO2: 100 % (12/20 0855)  O2 Device: None (Room air) (12/20 0855)  Height: 170.2 cm (5' 7) (12/20 9811)  Weight: 83.1 kg (183 lb 3.2 oz) (12/20 0855)      Patient History   Allergies   Allergen Reactions    Atorvastatin ITCHING    Farxiga [Dapagliflozin] ITCHING    Jardiance [Empagliflozin] HEADACHE and ITCHING     Less energy, headache, itching    Lisinopril SEE COMMENTS     'feels wiped out/like crap on medication'    Losartan SEE COMMENTS     'feels wiped out/like crap on medication'    Norvasc [Amlodipine] SEE COMMENTS     'feels wiped out/like crap on medication'        Current Medications    Medication Directions   CHOLEcalciferoL (vitamin D3) (OPTIMAL D3) 50,000 units capsule Take one capsule by mouth every 7 days.   fluticasone-salmeterol (ADVAIR DISKUS) 500-50 mcg inhalation disk Inhale one puff by mouth into the lungs twice daily.     furosemide (LASIX) 20 mg tablet Take one tablet by mouth as Needed (as needed daily with weight gain of 3 pounds in one day, 5 pounds in one week or with ankle swelling).   nitroglycerin (NITROSTAT) 0.4 mg tablet Place one tablet under tongue every 5 minutes as needed for Chest Pain. Max of 3 tablets, call 911.   valsartan (DIOVAN) 40 mg tablet Take one tablet by mouth daily.       Review of Systems/Medical History      Patient summary reviewed  Pertinent labs reviewed    PONV Screening: Non-smoker and Female sex    No history of anesthetic complications    No family history of anesthetic complications      Airway - negative        Pulmonary - negative            No recent URI      Previously diagnosed with COPD, asthma- normal PFTs. Uses Advair daily, combivent prn without recent use.       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):  MR              No palpitations      No angina      CHF (exercise induced HF) HFpEF        No orthopnea  Dyspnea on exertion      Pulmonary hypertension (normal PAP at rest, increase of PAP to 53/25 with exercise)      GI/Hepatic/Renal - negative            No GERD            Neuro/Psych - negative        Musculoskeletal - negative        No back pain        Endocrine/Other           No anemia      No history of blood transfusion        Constitution - negative       Physical Exam    Airway Findings      Mallampati: III      TM distance: >3 FB      Neck ROM: full      Upper Lip Bite Test: 1      Airway patency: adequate    Dental Findings:       Partials          Cardiovascular Findings:         Other findings: Murmur, carotid bruit, peripheral edema (trace)    Pulmonary Findings:       Breath sounds clear to auscultation.    Neurological Findings:       Alert and oriented x 3    Constitutional findings:       No acute distress      Well-developed       Diagnostic Tests  Hematology:   Lab Results   Component Value Date    HGB 13.6 04/16/2022    HCT 40.1 04/16/2022    PLTCT 252 04/16/2022    WBC 6.5 04/16/2022    NEUT 64.2 09/24/2021    ANC 4.58 09/24/2021    ALC 1.74 09/24/2021    MONA 5.6 09/24/2021    AMC 0.4 09/24/2021    ABC 0.06 09/24/2021    MCV 90.2 04/16/2022    MCH 30.6 04/16/2022    MCHC 33.9 04/16/2022    MPV 8.3 04/16/2022    RDW 13.2 04/16/2022         General Chemistry:   Lab Results   Component Value Date    NA 138 03/06/2022    K 4.1 03/06/2022    CL 107 03/06/2022    CO2 24.0 03/06/2022    GAP 7 03/06/2022    BUN 16.0 03/06/2022    CR 0.94 03/06/2022    GLU 90 03/06/2022    CA 8.8 03/06/2022    MG 1.8 02/10/2018    PO4 4.0 02/10/2018      Coagulation: No results found for: PT, PTT, INR        07/15/21 Cardiac Cath  FINAL IMPRESSION:    Patent epicardial coronary arteries with normal TIMI-3 flow and no significant obstructive coronary artery disease.  Normal left ventricular end-diastolic pressure without aortic stenosis at baseline.  Normal filling pressures at baseline with significant worsening of pulmonary artery pressure to 35 mmHg with a reduction in pulmonary artery saturation indicative of exercise-induced diastolic dysfunction.  Advanced Heart Failure/Transplant team referral.      09/06/21 Cardiac MRI  1.  Normal left ventricular cavity size, normal systolic function,   calculated ejection fraction 54%. No regional wall motion abnormalities   were noted   2.  Normal right ventricular cavity size, normal systolic function.   3.  Normal sized biatrium  4.  There is a focal area of transmural delayed gadolinium enhancement,   which is visualized in the mid to distal inferior wall which is visualized   in the short axis images, however it was not appreciated in the   four-chamber/two chamber views.   5.  The region in question in other views appears extracardiac. Parametric   data sampling this region was within normal limits, lowering suspicion for   infiltrative/inflammatory processes.   6.  Normal extracellular volume [21%].       Echocardiogram 01/21/2022  Interpretation Summary  Normal left ventricular cavitary dimensions with mild eccentric hypertrophy  Normal left ventricular systolic function with a visually estimated ejection fraction of ~65%.  Probably normal right ventricular cavitary size with probably normal RV systolic function.  Mild/grade 1 LV diastolic dysfunction with normal left atrial pressure  No left ventricular focal regional wall motion abnormality.  No chamber enlargement.  No hemodynamically significant valvular stenosis   Moderate, eccentric, posteriorly directed mitral insufficiency.  No significant mitral stenosis: Mean gradient 2 mmHg at 73 bpm.  The severely eccentric jet may underestimate the underlying severity of MR.  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  No pericardial effusion.     Direct visual comparison was made with a prior transthoracic echocardiogram dated August 16, 2021.  Both studies demonstrate similar biventricular function and wall motion pattern.  Extent of MR was moderate on the previous study, unchanged.  Consider transesophageal echocardiography for better assessment of the severity of MR if clinically indicated    02/07/22 TEE  Normal biventricular size and systolic function.  Visually estimated left ventricular ejection fraction = 60%.  Qualitatively normal biatrial dimensions.  No evidence of left atrial appendage thrombus.  No interatrial shunting by color-flow Doppler and saline contrast studies.  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  No mitral valve stenosis (Mean gradient = 2 mmHg at 65 bpm, MVA by 3D planimetry = 4.71 cm?).  No pericardial effusion.    03/14/22 PFT: normal study  Lab Results   Component Value Date    FVCPRE 2.78 03/14/2022    FVCPREDPRE 87 03/14/2022    FEV1PRE 2.19 03/14/2022    FEV1PREDPRE 86 03/14/2022       04/16/22 EKG: SR. Nonspecific St abnormality. Rate 61      04/16/22 Chest Xray:   Borderline enlarged heart without acute cardiopulmonary normality.           PAC Plan    Interview: Clinic Interview    ASA Score: 4                              PAC RISK ASSESSMENTS:   *D[facility] Activity Status Index (DASI): 13.45, Calculated METS: 4.4 (score < 25 correlates with increased risk for death, MI, and moderate to severe complications, max score 57)  *STOP-BANG Score: 2 (total 5-8 high risk for OSA, 3-4 with HCO3 >28 also high risk. OSA associated with greater than twice the odds for respiratory failure, cardiac events, ICU admission, and difficult intubation)          Alerts: No

## 2022-04-17 ENCOUNTER — Inpatient Hospital Stay: Admit: 2022-04-17 | Discharge: 2022-04-17 | Payer: No Typology Code available for payment source

## 2022-04-17 ENCOUNTER — Encounter: Admit: 2022-04-17 | Discharge: 2022-04-17 | Payer: No Typology Code available for payment source

## 2022-04-17 ENCOUNTER — Encounter: Admit: 2022-04-17 | Discharge: 2022-04-17 | Payer: MEDICARE

## 2022-04-17 DIAGNOSIS — M549 Dorsalgia, unspecified: Secondary | ICD-10-CM

## 2022-04-17 DIAGNOSIS — K289 Gastrojejunal ulcer, unspecified as acute or chronic, without hemorrhage or perforation: Secondary | ICD-10-CM

## 2022-04-17 DIAGNOSIS — J45909 Unspecified asthma, uncomplicated: Secondary | ICD-10-CM

## 2022-04-17 DIAGNOSIS — K319 Disease of stomach and duodenum, unspecified: Secondary | ICD-10-CM

## 2022-04-17 DIAGNOSIS — J449 Chronic obstructive pulmonary disease, unspecified: Secondary | ICD-10-CM

## 2022-04-17 MED ORDER — PROTAMINE 10 MG/ML IV SOLN
INTRAVENOUS | 0 refills | Status: DC
Start: 2022-04-17 — End: 2022-04-17

## 2022-04-17 MED ORDER — ARTIFICIAL TEARS (PF) SINGLE DOSE DROPS GROUP
OPHTHALMIC | 0 refills | Status: DC
Start: 2022-04-17 — End: 2022-04-17

## 2022-04-17 MED ORDER — AMINOCAPROIC ACID 250 MG/ML IV SOLN
INTRAVENOUS | 0 refills | Status: DC
Start: 2022-04-17 — End: 2022-04-17

## 2022-04-17 MED ORDER — AMINOCAPROIC ACID 12.5 GM INFUSION (OR)
INTRAVENOUS | 0 refills | Status: DC
Start: 2022-04-17 — End: 2022-04-17
  Administered 2022-04-17 (×2): 2 g/h via INTRAVENOUS

## 2022-04-17 MED ORDER — NOREPINEPHRINE IV DRIP STD CONC (AM)(OR)
INTRAVENOUS | 0 refills | Status: DC
Start: 2022-04-17 — End: 2022-04-17
  Administered 2022-04-17: 16:00:00 .02 ug/kg/min via INTRAVENOUS

## 2022-04-17 MED ORDER — PROPOFOL INJ 10 MG/ML IV VIAL
INTRAVENOUS | 0 refills | Status: DC
Start: 2022-04-17 — End: 2022-04-17

## 2022-04-17 MED ORDER — ELECTROLYTE-A IV SOLP
INTRAVENOUS | 0 refills | Status: DC
Start: 2022-04-17 — End: 2022-04-17

## 2022-04-17 MED ORDER — CEFAZOLIN 10 G/150 ML INFUSION (AN) (OSM)
INTRAVENOUS | 0 refills | Status: DC
Start: 2022-04-17 — End: 2022-04-17
  Administered 2022-04-17: 14:00:00 1 g/h via INTRAVENOUS

## 2022-04-17 MED ORDER — DEXAMETHASONE SODIUM PHOSPHATE 10 MG/ML IJ SOLN
0 refills | Status: CP
Start: 2022-04-17 — End: ?

## 2022-04-17 MED ORDER — LIDOCAINE (PF) 20 MG/ML (2 %) IJ SOLN
INTRAVENOUS | 0 refills | Status: DC
Start: 2022-04-17 — End: 2022-04-17

## 2022-04-17 MED ORDER — ROCURONIUM 10 MG/ML IV SOLN
INTRAVENOUS | 0 refills | Status: DC
Start: 2022-04-17 — End: 2022-04-17

## 2022-04-17 MED ORDER — AUTOLOGOUS BLOOD (CELL SAVER)
INTRAVENOUS | 0 refills | Status: DC
Start: 2022-04-17 — End: 2022-04-17

## 2022-04-17 MED ORDER — ROPIVACAINE (PF) 2 MG/ML (0.2 %) IJ SOLN
0 refills | Status: CP
Start: 2022-04-17 — End: ?

## 2022-04-17 MED ORDER — AMIODARONE 50 MG/ML IV SOLN
INTRAVENOUS | 0 refills | Status: DC
Start: 2022-04-17 — End: 2022-04-17

## 2022-04-17 MED ORDER — PHENYLEPHRINE HCL IN 0.9% NACL 1 MG/10 ML (100 MCG/ML) IV SYRG
INTRAVENOUS | 0 refills | Status: DC
Start: 2022-04-17 — End: 2022-04-17

## 2022-04-17 MED ORDER — HEPARIN (PORCINE) 1,000 UNIT/ML IJ SOLN
INTRAVENOUS | 0 refills | Status: DC
Start: 2022-04-17 — End: 2022-04-17

## 2022-04-17 MED ORDER — LIDOCAINE (PF) 10 MG/ML (1 %) IJ SOLN
SUBCUTANEOUS | 0 refills | Status: CP
Start: 2022-04-17 — End: ?

## 2022-04-17 MED ORDER — MAGNESIUM SULFATE IN D5W 1 GRAM/100 ML IV PGBK
INTRAVENOUS | 0 refills | Status: DC
Start: 2022-04-17 — End: 2022-04-17

## 2022-04-17 MED ORDER — NITROGLYCERIN IN 5 % DEXTROSE 50 MG/250 ML (200 MCG/ML) IV SOLN (INFUSION)(AM)(OR)
INTRAVENOUS | 0 refills | Status: DC
Start: 2022-04-17 — End: 2022-04-17
  Administered 2022-04-17: 15:00:00 .5 ug/kg/min via INTRAVENOUS

## 2022-04-17 MED ORDER — DOBUTAMINE IN D5W 1,000 MG/250 ML (4,000 MCG/ML) IV SOLP (INFUSION)(AM)(OR)
INTRAVENOUS | 0 refills | Status: DC
Start: 2022-04-17 — End: 2022-04-17
  Administered 2022-04-17: 19:00:00 3 ug/kg/min via INTRAVENOUS

## 2022-04-17 MED ORDER — INSULIN 100UNITS NS 100ML
INTRAVENOUS | 0 refills | Status: DC
Start: 2022-04-17 — End: 2022-04-17
  Administered 2022-04-17 (×2): 2 [IU]/h via INTRAVENOUS

## 2022-04-17 MED ORDER — CEFAZOLIN 1 GRAM IJ SOLR
INTRAVENOUS | 0 refills | Status: DC
Start: 2022-04-17 — End: 2022-04-17

## 2022-04-17 MED ORDER — ACETAMINOPHEN 1,000 MG/100 ML (10 MG/ML) IV SOLN
INTRAVENOUS | 0 refills | Status: DC
Start: 2022-04-17 — End: 2022-04-17

## 2022-04-17 MED ORDER — SODIUM CHLORIDE 0.9 % IV SOLP
INTRAVENOUS | 0 refills | Status: DC
Start: 2022-04-17 — End: 2022-04-17

## 2022-04-17 MED ORDER — GLYCOPYRROLATE 0.2 MG/ML IJ SOLN
INTRAVENOUS | 0 refills | Status: DC
Start: 2022-04-17 — End: 2022-04-17

## 2022-04-17 MED ORDER — MIDAZOLAM 1 MG/ML IJ SOLN
INTRAVENOUS | 0 refills | Status: DC
Start: 2022-04-17 — End: 2022-04-17

## 2022-04-17 MED ORDER — FENTANYL CITRATE (PF) 50 MCG/ML IJ SOLN
INTRAVENOUS | 0 refills | Status: DC
Start: 2022-04-17 — End: 2022-04-17

## 2022-04-17 MED ORDER — SUGAMMADEX 100 MG/ML IV SOLN
INTRAVENOUS | 0 refills | Status: DC
Start: 2022-04-17 — End: 2022-04-17

## 2022-04-17 MED ADMIN — DOBUTAMINE IN D5W 1,000 MG/250 ML (4,000 MCG/ML) IV SOLP [15982]: 3 ug/kg/min | INTRAVENOUS | @ 21:00:00 | Stop: 2022-04-18 | NDC 00338107702

## 2022-04-17 MED ADMIN — POTASSIUM CHLORIDE IN WATER 10 MEQ/50 ML IV PGBK [11075]: 10 meq | INTRAVENOUS | @ 22:00:00 | Stop: 2023-04-17 | NDC 00338070541

## 2022-04-17 MED ADMIN — MAGNESIUM SULFATE IN WATER 4 GRAM/50 ML (8 %) IV PGBK [166563]: 4 g | INTRAVENOUS | @ 21:00:00 | Stop: 2022-04-18 | NDC 00338171940

## 2022-04-17 MED ADMIN — HYDROMORPHONE (PF) 2 MG/ML IJ SYRG [163476]: 1 mg | INTRAVENOUS | @ 23:00:00 | Stop: 2022-04-18 | NDC 00409131203

## 2022-04-17 MED ADMIN — OXYCODONE 5 MG PO TAB [10814]: 10 mg | ORAL | @ 21:00:00 | NDC 00406055223

## 2022-04-17 MED ADMIN — DIAZEPAM 5 MG PO TAB [2405]: 5 mg | ORAL | @ 21:00:00 | NDC 51079028501

## 2022-04-17 MED ADMIN — HYDROMORPHONE (PF) 2 MG/ML IJ SYRG [163476]: 1 mg | INTRAVENOUS | @ 21:00:00 | Stop: 2022-04-17 | NDC 00409131203

## 2022-04-17 MED ADMIN — SODIUM CHLORIDE 0.9 % IV SOLP [27838]: 1000.000 mL | INTRAVENOUS | @ 13:00:00 | Stop: 2022-04-17 | NDC 00338004904

## 2022-04-17 MED ADMIN — HYDROMORPHONE (PF) 2 MG/ML IJ SYRG [163476]: 1 mg | INTRAVENOUS | @ 21:00:00 | Stop: 2022-04-18 | NDC 00409131203

## 2022-04-17 MED ADMIN — FENTANYL CITRATE (PF) 50 MCG/ML IJ SOLN [3037]: 50 ug | INTRAVENOUS | @ 20:00:00 | Stop: 2022-04-17 | NDC 00641602701

## 2022-04-18 ENCOUNTER — Inpatient Hospital Stay: Admit: 2022-04-18 | Discharge: 2022-04-18 | Payer: No Typology Code available for payment source

## 2022-04-18 MED ADMIN — DIAZEPAM 5 MG PO TAB [2405]: 5 mg | ORAL | @ 16:00:00 | NDC 51079028501

## 2022-04-18 MED ADMIN — LIDOCAINE 5 % TP PTMD [80759]: 2 | TOPICAL | @ 03:00:00 | NDC 00603188010

## 2022-04-18 MED ADMIN — LIDOCAINE 5 % TP PTMD [80759]: 2 | TOPICAL | @ 14:00:00 | NDC 00603188010

## 2022-04-18 MED ADMIN — FLUTICASONE FUROATE-VILANTEROL 200-25 MCG/DOSE IN DSDV [325370]: 1 | RESPIRATORY_TRACT | @ 16:00:00 | NDC 00173088214

## 2022-04-18 MED ADMIN — DIAZEPAM 5 MG PO TAB [2405]: 5 mg | ORAL | @ 22:00:00 | NDC 51079028501

## 2022-04-18 MED ADMIN — OXYCODONE 5 MG PO TAB [10814]: 10 mg | ORAL | @ 21:00:00 | NDC 00406055223

## 2022-04-18 MED ADMIN — CEFAZOLIN INJ 1GM IVP [210319]: 2 g | INTRAVENOUS | @ 13:00:00 | Stop: 2022-04-18 | NDC 60505614200

## 2022-04-18 MED ADMIN — ACETAMINOPHEN 1,000 MG/100 ML (10 MG/ML) IV SOLN [305632]: 1000 mg | INTRAVENOUS | @ 21:00:00 | Stop: 2022-04-19 | NDC 24201010001

## 2022-04-18 MED ADMIN — CEFAZOLIN INJ 1GM IVP [210319]: 2 g | INTRAVENOUS | @ 03:00:00 | Stop: 2022-04-19 | NDC 60505614200

## 2022-04-18 MED ADMIN — DIPHENHYDRAMINE HCL 12.5 MG/5 ML PO LIQD [12556]: 12.5 mg | ORAL | @ 07:00:00 | Stop: 2022-04-18 | NDC 60687026742

## 2022-04-18 MED ADMIN — POLYETHYLENE GLYCOL 3350 17 GRAM PO PWPK [25424]: 34 g | ORAL | @ 14:00:00 | NDC 00904693186

## 2022-04-18 MED ADMIN — LIDOCAINE 5 % TP PTMD [80759]: 2 | TOPICAL | @ 03:00:00 | NDC 00591352511

## 2022-04-18 MED ADMIN — DIAZEPAM 5 MG PO TAB [2405]: 5 mg | ORAL | @ 05:00:00 | NDC 51079028501

## 2022-04-18 MED ADMIN — ACETAMINOPHEN 1,000 MG/100 ML (10 MG/ML) IV SOLN [305632]: 1000 mg | INTRAVENOUS | @ 14:00:00 | Stop: 2022-04-19 | NDC 24201010001

## 2022-04-18 MED ADMIN — AMIODARONE 200 MG PO TAB [9066]: 400 mg | ORAL | @ 14:00:00 | NDC 00904699361

## 2022-04-18 MED ADMIN — SENNOSIDES-DOCUSATE SODIUM 8.6-50 MG PO TAB [40926]: 2 | ORAL | @ 14:00:00 | NDC 00536124810

## 2022-04-18 MED ADMIN — CEFAZOLIN INJ 1GM IVP [210319]: 2 g | INTRAVENOUS | @ 21:00:00 | Stop: 2022-04-18 | NDC 60505614200

## 2022-04-18 MED ADMIN — ALBUMIN, HUMAN 5 % IV SOLP [8982]: 250 mL | INTRAVENOUS | @ 03:00:00 | Stop: 2022-04-18 | NDC 68516521403

## 2022-04-18 MED ADMIN — OXYCODONE 5 MG PO TAB [10814]: 10 mg | ORAL | @ 05:00:00 | NDC 00406055223

## 2022-04-18 MED ADMIN — SODIUM CHLORIDE 0.9 % IV SOLP [27838]: 1000.000 mL | INTRAVENOUS | @ 21:00:00 | Stop: 2022-04-19 | NDC 00338004904

## 2022-04-18 MED ADMIN — KETOROLAC 30 MG/ML (1 ML) IJ SOLN [22473]: 15 mg | INTRAVENOUS | @ 10:00:00 | Stop: 2022-04-18 | NDC 72266011801

## 2022-04-18 MED ADMIN — OXYCODONE 5 MG PO TAB [10814]: 10 mg | ORAL | @ 13:00:00 | NDC 00406055223

## 2022-04-18 MED ADMIN — ACETAMINOPHEN 1,000 MG/100 ML (10 MG/ML) IV SOLN [305632]: 1000 mg | INTRAVENOUS | @ 10:00:00 | Stop: 2022-04-18 | NDC 24201010001

## 2022-04-18 MED ADMIN — OXYCODONE 5 MG PO TAB [10814]: 10 mg | ORAL | @ 17:00:00 | NDC 00406055223

## 2022-04-18 MED ADMIN — ASPIRIN 81 MG PO CHEW [680]: 81 mg | ORAL | @ 14:00:00 | NDC 66553000201

## 2022-04-18 MED ADMIN — HYDROMORPHONE (PF) 2 MG/ML IJ SYRG [163476]: 0.5 mg | INTRAVENOUS | @ 17:00:00 | Stop: 2022-04-18 | NDC 00409131203

## 2022-04-18 MED ADMIN — LIDOCAINE (PF) 100 MG/5 ML (2 %) IV SYRG [21378]: 100 mg | INTRAVENOUS | @ 02:00:00 | Stop: 2022-04-18 | NDC 76329339001

## 2022-04-18 MED ADMIN — MAGNESIUM SULFATE IN D5W 1 GRAM/100 ML IV PGBK [166578]: 1 g | INTRAVENOUS | @ 03:00:00 | Stop: 2022-04-18 | NDC 44567041024

## 2022-04-18 MED ADMIN — INSULIN ASPART 100 UNIT/ML SC FLEXPEN [87504]: 2 [IU] | SUBCUTANEOUS | @ 04:00:00 | NDC 00169633910

## 2022-04-18 MED ADMIN — ACETAMINOPHEN 1,000 MG/100 ML (10 MG/ML) IV SOLN [305632]: 1000 mg | INTRAVENOUS | @ 03:00:00 | NDC 24201010001

## 2022-04-18 MED ADMIN — HYDROMORPHONE (PF) 2 MG/ML IJ SYRG [163476]: 1 mg | INTRAVENOUS | @ 03:00:00 | Stop: 2022-04-18 | NDC 00409131203

## 2022-04-18 MED ADMIN — HYDROMORPHONE (PF) 2 MG/ML IJ SYRG [163476]: 0.5 mg | INTRAVENOUS | @ 19:00:00 | Stop: 2022-04-18 | NDC 00409131203

## 2022-04-19 ENCOUNTER — Encounter: Admit: 2022-04-19 | Discharge: 2022-04-19 | Payer: No Typology Code available for payment source

## 2022-04-19 ENCOUNTER — Inpatient Hospital Stay: Admit: 2022-04-19 | Discharge: 2022-04-19 | Payer: No Typology Code available for payment source

## 2022-04-19 DIAGNOSIS — K289 Gastrojejunal ulcer, unspecified as acute or chronic, without hemorrhage or perforation: Secondary | ICD-10-CM

## 2022-04-19 DIAGNOSIS — J45909 Unspecified asthma, uncomplicated: Secondary | ICD-10-CM

## 2022-04-19 DIAGNOSIS — J449 Chronic obstructive pulmonary disease, unspecified: Secondary | ICD-10-CM

## 2022-04-19 DIAGNOSIS — M549 Dorsalgia, unspecified: Secondary | ICD-10-CM

## 2022-04-19 DIAGNOSIS — K319 Disease of stomach and duodenum, unspecified: Secondary | ICD-10-CM

## 2022-04-19 MED ADMIN — OXYCODONE 5 MG PO TAB [10814]: 10 mg | ORAL | @ 20:00:00 | NDC 00406055223

## 2022-04-19 MED ADMIN — SENNOSIDES-DOCUSATE SODIUM 8.6-50 MG PO TAB [40926]: 2 | ORAL | @ 02:00:00 | NDC 00536124810

## 2022-04-19 MED ADMIN — ASPIRIN 81 MG PO CHEW [680]: 81 mg | ORAL | @ 15:00:00 | NDC 66553000201

## 2022-04-19 MED ADMIN — MAGNESIUM SULFATE IN D5W 1 GRAM/100 ML IV PGBK [166578]: 1 g | INTRAVENOUS | @ 10:00:00 | Stop: 2023-04-17 | NDC 44567041024

## 2022-04-19 MED ADMIN — HYDROMORPHONE (PF) 2 MG/ML IJ SYRG [163476]: 1 mg | INTRAVENOUS | @ 03:00:00 | NDC 00409131203

## 2022-04-19 MED ADMIN — POLYETHYLENE GLYCOL 3350 17 GRAM PO PWPK [25424]: 34 g | ORAL | @ 02:00:00 | NDC 00904693186

## 2022-04-19 MED ADMIN — POLYETHYLENE GLYCOL 3350 17 GRAM PO PWPK [25424]: 34 g | ORAL | @ 15:00:00 | NDC 00904693186

## 2022-04-19 MED ADMIN — HYDROMORPHONE (PF) 2 MG/ML IJ SYRG [163476]: 1 mg | INTRAVENOUS | @ 06:00:00 | NDC 00409131203

## 2022-04-19 MED ADMIN — POTASSIUM CHLORIDE 20 MEQ PO TBTQ [35943]: 20 meq | ORAL | @ 10:00:00 | Stop: 2023-04-17 | NDC 00832532511

## 2022-04-19 MED ADMIN — DIAZEPAM 5 MG PO TAB [2405]: 5 mg | ORAL | @ 23:00:00 | NDC 51079028501

## 2022-04-19 MED ADMIN — ACETAMINOPHEN 500 MG PO TAB [102]: 1000 mg | ORAL | @ 14:00:00 | Stop: 2022-04-23 | NDC 00904673061

## 2022-04-19 MED ADMIN — AMIODARONE 200 MG PO TAB [9066]: 400 mg | ORAL | @ 02:00:00 | NDC 00904699361

## 2022-04-19 MED ADMIN — SENNOSIDES-DOCUSATE SODIUM 8.6-50 MG PO TAB [40926]: 2 | ORAL | @ 15:00:00 | NDC 00536124810

## 2022-04-19 MED ADMIN — OXYCODONE 5 MG PO TAB [10814]: 10 mg | ORAL | @ 14:00:00 | NDC 00406055223

## 2022-04-19 MED ADMIN — OXYCODONE 5 MG PO TAB [10814]: 10 mg | ORAL | @ 01:00:00 | NDC 00406055223

## 2022-04-19 MED ADMIN — OXYCODONE 5 MG PO TAB [10814]: 10 mg | ORAL | @ 09:00:00 | NDC 00406055223

## 2022-04-19 MED ADMIN — POTASSIUM CHLORIDE 10 MEQ PO TBTQ [35942]: 20 meq | ORAL | Stop: 2023-04-17 | NDC 00245531789

## 2022-04-19 MED ADMIN — MAGNESIUM OXIDE 400 MG (241.3 MG MAGNESIUM) PO TAB [10491]: 200 mg | ORAL | Stop: 2023-04-17 | NDC 64980033912

## 2022-04-19 MED ADMIN — DIAZEPAM 5 MG PO TAB [2405]: 5 mg | ORAL | @ 15:00:00 | NDC 51079028501

## 2022-04-19 MED ADMIN — IPRATROPIUM-ALBUTEROL 20-100 MCG/ACTUATION IN MIST [322811]: 1 | RESPIRATORY_TRACT | NDC 00597002402

## 2022-04-19 MED ADMIN — ACETAMINOPHEN 1,000 MG/100 ML (10 MG/ML) IV SOLN [305632]: 1000 mg | INTRAVENOUS | @ 02:00:00 | Stop: 2022-04-19 | NDC 55150030701

## 2022-04-19 MED ADMIN — AMIODARONE 200 MG PO TAB [9066]: 400 mg | ORAL | @ 15:00:00 | NDC 00904699361

## 2022-04-19 MED ADMIN — MIDODRINE 5 MG PO TAB [10610]: 10 mg | ORAL | @ 20:00:00 | NDC 00904681861

## 2022-04-19 MED ADMIN — ACETAMINOPHEN 500 MG PO TAB [102]: 1000 mg | ORAL | @ 20:00:00 | Stop: 2022-04-23 | NDC 00904673061

## 2022-04-19 MED ADMIN — NALOXEGOL 12.5 MG PO TAB [324272]: 25 mg | ORAL | @ 20:00:00 | Stop: 2022-04-19 | NDC 82625880101

## 2022-04-19 MED ADMIN — NOREPINEPHRINE BITARTRATE-NACL 4 MG/250 ML (16 MCG/ML) IV SOLN [173409]: 0.02 ug/kg/min | INTRAVENOUS | @ 02:00:00 | NDC 44567064001

## 2022-04-19 MED ADMIN — LIDOCAINE 5 % TP PTMD [80759]: 2 | TOPICAL | @ 15:00:00 | NDC 00603188010

## 2022-04-19 MED ADMIN — ACETAMINOPHEN 1,000 MG/100 ML (10 MG/ML) IV SOLN [305632]: 1000 mg | INTRAVENOUS | @ 09:00:00 | Stop: 2022-04-19 | NDC 55150030701

## 2022-04-20 ENCOUNTER — Inpatient Hospital Stay: Admit: 2022-04-20 | Discharge: 2022-04-20 | Payer: No Typology Code available for payment source

## 2022-04-20 MED ADMIN — OXYCODONE 5 MG PO TAB [10814]: 10 mg | ORAL | @ 09:00:00 | NDC 00406055223

## 2022-04-20 MED ADMIN — HEPARIN, PORCINE (PF) 5,000 UNIT/0.5 ML IJ SYRG [95535]: 5000 [IU] | SUBCUTANEOUS | @ 21:00:00 | NDC 00409131611

## 2022-04-20 MED ADMIN — METHYLNALTREXONE 12 MG/0.6 ML SC SOLN [168825]: 12 mg | SUBCUTANEOUS | @ 12:00:00 | Stop: 2022-04-20 | NDC 65649055102

## 2022-04-20 MED ADMIN — MIDODRINE 5 MG PO TAB [10610]: 10 mg | ORAL | @ 21:00:00 | NDC 00904681861

## 2022-04-20 MED ADMIN — LIDOCAINE 5 % TP PTMD [80759]: 2 | TOPICAL | @ 14:00:00 | NDC 00603188010

## 2022-04-20 MED ADMIN — GUAIFENESIN 100 MG/5 ML PO LIQD [79220]: 100 mg | ORAL | @ 22:00:00 | NDC 00121174405

## 2022-04-20 MED ADMIN — MAGNESIUM SULFATE IN D5W 1 GRAM/100 ML IV PGBK [166578]: 1 g | INTRAVENOUS | @ 09:00:00 | Stop: 2023-04-17 | NDC 00264440054

## 2022-04-20 MED ADMIN — TRAMADOL 50 MG PO TAB [14632]: 50 mg | ORAL | @ 15:00:00 | NDC 00904717961

## 2022-04-20 MED ADMIN — MIDODRINE 5 MG PO TAB [10610]: 10 mg | ORAL | @ 12:00:00 | NDC 00904681861

## 2022-04-20 MED ADMIN — GUAIFENESIN 100 MG/5 ML PO LIQD [79220]: 100 mg | ORAL | @ 18:00:00 | NDC 00121174405

## 2022-04-20 MED ADMIN — BISACODYL 10 MG RE SUPP [1080]: 20 mg | RECTAL | @ 12:00:00 | Stop: 2022-04-20 | NDC 00574705012

## 2022-04-20 MED ADMIN — HYDROMORPHONE (PF) 2 MG/ML IJ SYRG [163476]: 1 mg | INTRAVENOUS | @ 04:00:00 | NDC 00409131203

## 2022-04-20 MED ADMIN — HEPARIN, PORCINE (PF) 5,000 UNIT/0.5 ML IJ SYRG [95535]: 5000 [IU] | SUBCUTANEOUS | @ 12:00:00 | NDC 00409131611

## 2022-04-20 MED ADMIN — FUROSEMIDE 10 MG/ML IJ SOLN [3291]: 20 mg | INTRAVENOUS | @ 16:00:00 | Stop: 2022-04-20 | NDC 63323028001

## 2022-04-20 MED ADMIN — DIAZEPAM 5 MG PO TAB [2405]: 5 mg | ORAL | @ 07:00:00 | NDC 51079028501

## 2022-04-20 MED ADMIN — AMIODARONE 200 MG PO TAB [9066]: 400 mg | ORAL | @ 03:00:00 | NDC 00904699361

## 2022-04-20 MED ADMIN — SENNOSIDES-DOCUSATE SODIUM 8.6-50 MG PO TAB [40926]: 2 | ORAL | @ 14:00:00 | NDC 00536124810

## 2022-04-20 MED ADMIN — OXYCODONE 5 MG PO TAB [10814]: 10 mg | ORAL | @ 03:00:00 | NDC 00406055223

## 2022-04-20 MED ADMIN — POTASSIUM CHLORIDE 10 MEQ PO TBTQ [35942]: 20 meq | ORAL | @ 09:00:00 | Stop: 2023-04-17 | NDC 00245531789

## 2022-04-20 MED ADMIN — MAGNESIUM HYDROXIDE 400 MG/5 ML PO SUSP [79944]: 30 mL | ORAL | @ 14:00:00 | NDC 00121043130

## 2022-04-20 MED ADMIN — POLYETHYLENE GLYCOL 3350 17 GRAM PO PWPK [25424]: 34 g | ORAL | @ 03:00:00 | NDC 00904693186

## 2022-04-20 MED ADMIN — ACETAMINOPHEN 500 MG PO TAB [102]: 1000 mg | ORAL | @ 21:00:00 | Stop: 2022-04-23 | NDC 00904673061

## 2022-04-20 MED ADMIN — SENNOSIDES-DOCUSATE SODIUM 8.6-50 MG PO TAB [40926]: 2 | ORAL | @ 03:00:00 | NDC 00536124810

## 2022-04-20 MED ADMIN — OXYCODONE 5 MG PO TAB [10814]: 10 mg | ORAL | @ 14:00:00 | NDC 00406055223

## 2022-04-20 MED ADMIN — DIAZEPAM 5 MG PO TAB [2405]: 5 mg | ORAL | @ 14:00:00 | NDC 51079028501

## 2022-04-20 MED ADMIN — ACETAMINOPHEN 500 MG PO TAB [102]: 1000 mg | ORAL | @ 14:00:00 | Stop: 2022-04-23 | NDC 00904673061

## 2022-04-20 MED ADMIN — MAGNESIUM SULFATE IN D5W 1 GRAM/100 ML IV PGBK [166578]: 1 g | INTRAVENOUS | @ 11:00:00 | Stop: 2022-04-20 | NDC 44567041024

## 2022-04-20 MED ADMIN — ASPIRIN 81 MG PO CHEW [680]: 81 mg | ORAL | @ 14:00:00 | NDC 66553000201

## 2022-04-20 MED ADMIN — MIDODRINE 5 MG PO TAB [10610]: 10 mg | ORAL | @ 03:00:00 | NDC 00904681861

## 2022-04-20 MED ADMIN — AMIODARONE 200 MG PO TAB [9066]: 400 mg | ORAL | @ 14:00:00 | NDC 00904699361

## 2022-04-20 MED ADMIN — OXYCODONE 5 MG PO TAB [10814]: 10 mg | ORAL | @ 18:00:00 | NDC 00406055223

## 2022-04-20 MED ADMIN — TRAMADOL 50 MG PO TAB [14632]: 50 mg | ORAL | @ 14:00:00 | NDC 00904717961

## 2022-04-20 MED ADMIN — DIAZEPAM 5 MG PO TAB [2405]: 5 mg | ORAL | @ 22:00:00 | NDC 51079028501

## 2022-04-20 MED ADMIN — POLYETHYLENE GLYCOL 3350 17 GRAM PO PWPK [25424]: 34 g | ORAL | @ 14:00:00 | NDC 00904693186

## 2022-04-20 MED ADMIN — ACETAMINOPHEN 500 MG PO TAB [102]: 1000 mg | ORAL | @ 03:00:00 | Stop: 2022-04-23 | NDC 00904673061

## 2022-04-21 ENCOUNTER — Inpatient Hospital Stay: Admit: 2022-04-21 | Discharge: 2022-04-21 | Payer: No Typology Code available for payment source

## 2022-04-21 MED ADMIN — OXYCODONE 5 MG PO TAB [10814]: 5 mg | ORAL | @ 20:00:00 | NDC 00406055223

## 2022-04-21 MED ADMIN — FUROSEMIDE 40 MG PO TAB [3295]: 40 mg | ORAL | @ 15:00:00 | NDC 51079007301

## 2022-04-21 MED ADMIN — GUAIFENESIN 100 MG/5 ML PO LIQD [79220]: 100 mg | ORAL | @ 02:00:00 | NDC 00121174405

## 2022-04-21 MED ADMIN — OXYCODONE 5 MG PO TAB [10814]: 10 mg | ORAL | @ 02:00:00 | NDC 00406055223

## 2022-04-21 MED ADMIN — ACETAMINOPHEN 500 MG PO TAB [102]: 1000 mg | ORAL | @ 20:00:00 | Stop: 2022-04-23 | NDC 00904673061

## 2022-04-21 MED ADMIN — AMIODARONE 200 MG PO TAB [9066]: 400 mg | ORAL | @ 15:00:00 | NDC 00904699361

## 2022-04-21 MED ADMIN — OXYCODONE 5 MG PO TAB [10814]: 10 mg | ORAL | @ 09:00:00 | NDC 00406055223

## 2022-04-21 MED ADMIN — HEPARIN, PORCINE (PF) 5,000 UNIT/0.5 ML IJ SYRG [95535]: 5000 [IU] | SUBCUTANEOUS | @ 12:00:00 | NDC 00409131611

## 2022-04-21 MED ADMIN — HEPARIN, PORCINE (PF) 5,000 UNIT/0.5 ML IJ SYRG [95535]: 5000 [IU] | SUBCUTANEOUS | @ 20:00:00 | NDC 00409131611

## 2022-04-21 MED ADMIN — METOPROLOL TARTRATE 25 MG PO TAB [37637]: 12.5 mg | ORAL | @ 16:00:00 | NDC 62584026511

## 2022-04-21 MED ADMIN — MAGNESIUM OXIDE 400 MG (241.3 MG MAGNESIUM) PO TAB [10491]: 200 mg | ORAL | @ 15:00:00 | Stop: 2023-04-17 | NDC 10006070028

## 2022-04-21 MED ADMIN — MIDODRINE 5 MG PO TAB [10610]: 10 mg | ORAL | @ 20:00:00 | NDC 00245021211

## 2022-04-21 MED ADMIN — SENNOSIDES-DOCUSATE SODIUM 8.6-50 MG PO TAB [40926]: 2 | ORAL | @ 15:00:00 | NDC 00536124810

## 2022-04-21 MED ADMIN — GUAIFENESIN 100 MG/5 ML PO LIQD [79220]: 100 mg | ORAL | @ 18:00:00 | NDC 00121174405

## 2022-04-21 MED ADMIN — DIAZEPAM 5 MG PO TAB [2405]: 5 mg | ORAL | @ 05:00:00 | NDC 51079028501

## 2022-04-21 MED ADMIN — ASPIRIN 81 MG PO CHEW [680]: 81 mg | ORAL | @ 15:00:00 | NDC 66553000201

## 2022-04-21 MED ADMIN — OXYCODONE 5 MG PO TAB [10814]: 10 mg | ORAL | @ 16:00:00 | NDC 00406055223

## 2022-04-21 MED ADMIN — HEPARIN, PORCINE (PF) 5,000 UNIT/0.5 ML IJ SYRG [95535]: 5000 [IU] | SUBCUTANEOUS | @ 02:00:00 | NDC 00409131611

## 2022-04-21 MED ADMIN — GUAIFENESIN 100 MG/5 ML PO LIQD [79220]: 100 mg | ORAL | @ 05:00:00 | NDC 00121174405

## 2022-04-21 MED ADMIN — ACETAMINOPHEN 500 MG PO TAB [102]: 1000 mg | ORAL | @ 02:00:00 | Stop: 2022-04-23 | NDC 00904673061

## 2022-04-21 MED ADMIN — GUAIFENESIN 100 MG/5 ML PO LIQD [79220]: 100 mg | ORAL | @ 15:00:00 | NDC 00121174405

## 2022-04-21 MED ADMIN — ACETAMINOPHEN 500 MG PO TAB [102]: 1000 mg | ORAL | @ 15:00:00 | Stop: 2022-04-23 | NDC 00904673061

## 2022-04-21 MED ADMIN — MIDODRINE 5 MG PO TAB [10610]: 10 mg | ORAL | @ 12:00:00 | NDC 00245021211

## 2022-04-21 MED ADMIN — GUAIFENESIN 100 MG/5 ML PO LIQD [79220]: 100 mg | ORAL | @ 23:00:00 | NDC 00121174405

## 2022-04-21 MED ADMIN — AMIODARONE 200 MG PO TAB [9066]: 400 mg | ORAL | @ 02:00:00 | NDC 00904699361

## 2022-04-21 MED ADMIN — GUAIFENESIN 100 MG/5 ML PO LIQD [79220]: 100 mg | ORAL | @ 09:00:00 | NDC 00121174405

## 2022-04-21 MED ADMIN — LIDOCAINE 5 % TP PTMD [80759]: 2 | TOPICAL | @ 15:00:00 | NDC 00591352511

## 2022-04-21 MED ADMIN — MIDODRINE 5 MG PO TAB [10610]: 10 mg | ORAL | @ 05:00:00 | NDC 00245021211

## 2022-04-22 ENCOUNTER — Encounter: Admit: 2022-04-22 | Discharge: 2022-04-22 | Payer: No Typology Code available for payment source

## 2022-04-22 MED ADMIN — SENNOSIDES-DOCUSATE SODIUM 8.6-50 MG PO TAB [40926]: 2 | ORAL | @ 03:00:00 | NDC 00536124810

## 2022-04-22 MED ADMIN — OXYCODONE 5 MG PO TAB [10814]: 5 mg | ORAL | @ 18:00:00 | NDC 00406055223

## 2022-04-22 MED ADMIN — TRAMADOL 50 MG PO TAB [14632]: 100 mg | ORAL | @ 15:00:00 | NDC 00904717961

## 2022-04-22 MED ADMIN — GUAIFENESIN 100 MG/5 ML PO LIQD [79220]: 100 mg | ORAL | @ 03:00:00 | NDC 00121174405

## 2022-04-22 MED ADMIN — ALBUMIN, HUMAN 5 % IV SOLP [8982]: 250 mL | INTRAVENOUS | @ 17:00:00 | Stop: 2022-04-22 | NDC 68516521403

## 2022-04-22 MED ADMIN — ACETAMINOPHEN 500 MG PO TAB [102]: 1000 mg | ORAL | @ 15:00:00 | Stop: 2022-04-22 | NDC 00904673061

## 2022-04-22 MED ADMIN — FUROSEMIDE 40 MG PO TAB [3295]: 40 mg | ORAL | @ 15:00:00 | NDC 51079007301

## 2022-04-22 MED ADMIN — GUAIFENESIN 100 MG/5 ML PO LIQD [79220]: 100 mg | ORAL | @ 21:00:00 | NDC 00121174405

## 2022-04-22 MED ADMIN — PANTOPRAZOLE 40 MG IV SOLR [78621]: 40 mg | INTRAVENOUS | @ 03:00:00 | NDC 55150020200

## 2022-04-22 MED ADMIN — MIDODRINE 5 MG PO TAB [10610]: 10 mg | ORAL | @ 13:00:00 | NDC 00245021211

## 2022-04-22 MED ADMIN — HEPARIN, PORCINE (PF) 5,000 UNIT/0.5 ML IJ SYRG [95535]: 5000 [IU] | SUBCUTANEOUS | @ 13:00:00 | NDC 00409131611

## 2022-04-22 MED ADMIN — AMIODARONE 200 MG PO TAB [9066]: 400 mg | ORAL | @ 03:00:00 | NDC 00904699361

## 2022-04-22 MED ADMIN — AMIODARONE 200 MG PO TAB [9066]: 400 mg | ORAL | @ 15:00:00 | NDC 00904699361

## 2022-04-22 MED ADMIN — MAGNESIUM OXIDE 400 MG (241.3 MG MAGNESIUM) PO TAB [10491]: 400 mg | ORAL | @ 13:00:00 | Stop: 2023-04-17 | NDC 10006070028

## 2022-04-22 MED ADMIN — LIDOCAINE 5 % TP PTMD [80759]: 2 | TOPICAL | @ 15:00:00 | NDC 00591352511

## 2022-04-22 MED ADMIN — POTASSIUM CHLORIDE 20 MEQ PO TBTQ [35943]: 20 meq | ORAL | @ 13:00:00 | Stop: 2023-04-17 | NDC 00832532511

## 2022-04-22 MED ADMIN — GUAIFENESIN 100 MG/5 ML PO LIQD [79220]: 100 mg | ORAL | @ 18:00:00 | NDC 00121174405

## 2022-04-22 MED ADMIN — HEPARIN, PORCINE (PF) 5,000 UNIT/0.5 ML IJ SYRG [95535]: 5000 [IU] | SUBCUTANEOUS | @ 20:00:00 | NDC 00409131611

## 2022-04-22 MED ADMIN — GUAIFENESIN 100 MG/5 ML PO LIQD [79220]: 100 mg | ORAL | @ 10:00:00 | NDC 00121174405

## 2022-04-22 MED ADMIN — HEPARIN, PORCINE (PF) 5,000 UNIT/0.5 ML IJ SYRG [95535]: 5000 [IU] | SUBCUTANEOUS | @ 05:00:00 | NDC 00409131611

## 2022-04-22 MED ADMIN — ACETAMINOPHEN 500 MG PO TAB [102]: 1000 mg | ORAL | @ 03:00:00 | Stop: 2022-04-23 | NDC 00904673061

## 2022-04-22 MED ADMIN — POLYETHYLENE GLYCOL 3350 17 GRAM PO PWPK [25424]: 34 g | ORAL | @ 03:00:00 | NDC 00904693186

## 2022-04-22 MED ADMIN — OXYCODONE 5 MG PO TAB [10814]: 5 mg | ORAL | @ 10:00:00 | NDC 00406055223

## 2022-04-22 MED ADMIN — MIDODRINE 5 MG PO TAB [10610]: 10 mg | ORAL | @ 20:00:00 | NDC 00245021211

## 2022-04-22 MED ADMIN — ACETAMINOPHEN 500 MG PO TAB [102]: 1000 mg | ORAL | @ 20:00:00 | Stop: 2022-04-22 | NDC 00904673061

## 2022-04-22 MED ADMIN — METOPROLOL TARTRATE 25 MG PO TAB [37637]: 12.5 mg | ORAL | @ 03:00:00 | NDC 62584026511

## 2022-04-22 MED ADMIN — METOPROLOL TARTRATE 25 MG PO TAB [37637]: 12.5 mg | ORAL | @ 15:00:00 | NDC 62584026511

## 2022-04-22 MED ADMIN — SENNOSIDES-DOCUSATE SODIUM 8.6-50 MG PO TAB [40926]: 2 | ORAL | @ 15:00:00 | NDC 00536124810

## 2022-04-22 MED ADMIN — POLYETHYLENE GLYCOL 3350 17 GRAM PO PWPK [25424]: 34 g | ORAL | @ 15:00:00 | NDC 00904693186

## 2022-04-22 MED ADMIN — ACETAMINOPHEN 325 MG PO TAB [101]: 650 mg | ORAL | @ 10:00:00 | NDC 00904677361

## 2022-04-22 MED ADMIN — GUAIFENESIN 100 MG/5 ML PO LIQD [79220]: 100 mg | ORAL | @ 15:00:00 | NDC 00121174405

## 2022-04-22 MED ADMIN — DIAZEPAM 5 MG PO TAB [2405]: 5 mg | ORAL | @ 03:00:00 | NDC 51079028501

## 2022-04-22 MED ADMIN — MIDODRINE 5 MG PO TAB [10610]: 10 mg | ORAL | @ 05:00:00 | NDC 00245021211

## 2022-04-22 MED ADMIN — MAGNESIUM HYDROXIDE 400 MG/5 ML PO SUSP [79944]: 30 mL | ORAL | @ 03:00:00 | NDC 00121043130

## 2022-04-22 MED ADMIN — MAGNESIUM HYDROXIDE 400 MG/5 ML PO SUSP [79944]: 30 mL | ORAL | @ 15:00:00 | NDC 00121043130

## 2022-04-22 MED ADMIN — ASPIRIN 81 MG PO CHEW [680]: 81 mg | ORAL | @ 15:00:00 | NDC 66553000201

## 2022-04-23 ENCOUNTER — Inpatient Hospital Stay: Admit: 2022-04-23 | Discharge: 2022-04-23 | Payer: No Typology Code available for payment source

## 2022-04-23 ENCOUNTER — Encounter: Admit: 2022-04-23 | Discharge: 2022-04-23 | Payer: No Typology Code available for payment source

## 2022-04-23 MED ADMIN — OXYCODONE 5 MG PO TAB [10814]: 5 mg | ORAL | @ 23:00:00 | NDC 00406055223

## 2022-04-23 MED ADMIN — ASPIRIN 81 MG PO CHEW [680]: 81 mg | ORAL | @ 15:00:00 | NDC 66553000201

## 2022-04-23 MED ADMIN — TRAMADOL 50 MG PO TAB [14632]: 100 mg | ORAL | @ 22:00:00 | NDC 00904717961

## 2022-04-23 MED ADMIN — FUROSEMIDE 40 MG PO TAB [3295]: 40 mg | ORAL | @ 15:00:00 | NDC 51079007301

## 2022-04-23 MED ADMIN — AMIODARONE 200 MG PO TAB [9066]: 400 mg | ORAL | @ 03:00:00 | NDC 00904699361

## 2022-04-23 MED ADMIN — ACETAMINOPHEN 325 MG PO TAB [101]: 650 mg | ORAL | @ 07:00:00 | NDC 00904677361

## 2022-04-23 MED ADMIN — GUAIFENESIN 100 MG/5 ML PO LIQD [79220]: 100 mg | ORAL | @ 15:00:00 | NDC 00121174405

## 2022-04-23 MED ADMIN — GUAIFENESIN 100 MG/5 ML PO LIQD [79220]: 100 mg | ORAL | @ 11:00:00 | NDC 00121174405

## 2022-04-23 MED ADMIN — HEPARIN, PORCINE (PF) 5,000 UNIT/0.5 ML IJ SYRG [95535]: 5000 [IU] | SUBCUTANEOUS | @ 11:00:00 | NDC 00409131611

## 2022-04-23 MED ADMIN — GUAIFENESIN 100 MG/5 ML PO LIQD [79220]: 100 mg | ORAL | @ 22:00:00 | NDC 00121174405

## 2022-04-23 MED ADMIN — MIDODRINE 5 MG PO TAB [10610]: 10 mg | ORAL | @ 19:00:00 | NDC 00904681861

## 2022-04-23 MED ADMIN — AMIODARONE 200 MG PO TAB [9066]: 400 mg | ORAL | @ 15:00:00 | NDC 00904699361

## 2022-04-23 MED ADMIN — POTASSIUM CHLORIDE 20 MEQ PO TBTQ [35943]: 20 meq | ORAL | @ 11:00:00 | Stop: 2023-04-17 | NDC 00832532511

## 2022-04-23 MED ADMIN — SENNOSIDES-DOCUSATE SODIUM 8.6-50 MG PO TAB [40926]: 2 | ORAL | @ 15:00:00 | NDC 00536124810

## 2022-04-23 MED ADMIN — MIDODRINE 5 MG PO TAB [10610]: 10 mg | ORAL | @ 03:00:00 | NDC 00904681861

## 2022-04-23 MED ADMIN — LIDOCAINE 5 % TP PTMD [80759]: 2 | TOPICAL | @ 15:00:00 | NDC 00603188010

## 2022-04-23 MED ADMIN — OXYCODONE 5 MG PO TAB [10814]: 5 mg | ORAL | @ 12:00:00 | NDC 00406055223

## 2022-04-23 MED ADMIN — HEPARIN, PORCINE (PF) 5,000 UNIT/0.5 ML IJ SYRG [95535]: 5000 [IU] | SUBCUTANEOUS | @ 03:00:00 | NDC 00409131611

## 2022-04-23 MED ADMIN — TRAMADOL 50 MG PO TAB [14632]: 100 mg | ORAL | @ 16:00:00 | NDC 00904717961

## 2022-04-23 MED ADMIN — ACETAMINOPHEN 325 MG PO TAB [101]: 650 mg | ORAL | @ 03:00:00 | NDC 00904677361

## 2022-04-23 MED ADMIN — PANTOPRAZOLE 40 MG PO TBEC [80436]: 40 mg | ORAL | @ 19:00:00 | NDC 00904647461

## 2022-04-23 MED ADMIN — DIAZEPAM 5 MG PO TAB [2405]: 5 mg | ORAL | @ 17:00:00 | NDC 51079028501

## 2022-04-23 MED ADMIN — GUAIFENESIN 100 MG/5 ML PO LIQD [79220]: 100 mg | ORAL | @ 03:00:00 | NDC 00121174405

## 2022-04-23 MED ADMIN — MAGNESIUM HYDROXIDE 400 MG/5 ML PO SUSP [79944]: 30 mL | ORAL | @ 15:00:00 | NDC 00121043130

## 2022-04-23 MED ADMIN — ACETAMINOPHEN 325 MG PO TAB [101]: 650 mg | ORAL | @ 17:00:00 | NDC 00904677361

## 2022-04-23 MED ADMIN — PANTOPRAZOLE 40 MG IV SOLR [78621]: 40 mg | INTRAVENOUS | @ 03:00:00 | NDC 55150020200

## 2022-04-23 MED ADMIN — DIAZEPAM 5 MG PO TAB [2405]: 5 mg | ORAL | @ 03:00:00 | NDC 51079028501

## 2022-04-23 MED ADMIN — MAGNESIUM OXIDE 400 MG (241.3 MG MAGNESIUM) PO TAB [10491]: 200 mg | ORAL | @ 11:00:00 | Stop: 2023-04-17 | NDC 10006070028

## 2022-04-23 MED ADMIN — GUAIFENESIN 100 MG/5 ML PO LIQD [79220]: 100 mg | ORAL | @ 17:00:00 | NDC 00121174405

## 2022-04-23 MED ADMIN — MIDODRINE 5 MG PO TAB [10610]: 10 mg | ORAL | @ 11:00:00 | NDC 00904681861

## 2022-04-23 MED ADMIN — HEPARIN, PORCINE (PF) 5,000 UNIT/0.5 ML IJ SYRG [95535]: 5000 [IU] | SUBCUTANEOUS | @ 19:00:00 | NDC 00409131611

## 2022-04-24 ENCOUNTER — Inpatient Hospital Stay: Admit: 2022-04-24 | Discharge: 2022-04-24 | Payer: No Typology Code available for payment source

## 2022-04-24 ENCOUNTER — Encounter: Admit: 2022-04-24 | Discharge: 2022-04-24 | Payer: No Typology Code available for payment source

## 2022-04-24 MED ADMIN — MIDODRINE 5 MG PO TAB [10610]: 10 mg | ORAL | @ 22:00:00 | NDC 00245021211

## 2022-04-24 MED ADMIN — OXYCODONE 5 MG PO TAB [10814]: 5 mg | ORAL | @ 12:00:00 | NDC 00406055223

## 2022-04-24 MED ADMIN — SENNOSIDES-DOCUSATE SODIUM 8.6-50 MG PO TAB [40926]: 2 | ORAL | @ 14:00:00 | NDC 00536124810

## 2022-04-24 MED ADMIN — OXYCODONE 5 MG PO TAB [10814]: 10 mg | ORAL | @ 22:00:00 | NDC 00406055223

## 2022-04-24 MED ADMIN — GUAIFENESIN 100 MG/5 ML PO LIQD [79220]: 100 mg | ORAL | @ 10:00:00 | NDC 00121174405

## 2022-04-24 MED ADMIN — DIAZEPAM 5 MG PO TAB [2405]: 5 mg | ORAL | NDC 51079028501

## 2022-04-24 MED ADMIN — PANTOPRAZOLE 40 MG PO TBEC [80436]: 40 mg | ORAL | @ 23:00:00 | NDC 00904647461

## 2022-04-24 MED ADMIN — MIDAZOLAM 1 MG/ML IJ SOLN [10607]: 1 mg | INTRAVENOUS | @ 20:00:00 | Stop: 2022-04-24 | NDC 63323041115

## 2022-04-24 MED ADMIN — GUAIFENESIN 100 MG/5 ML PO LIQD [79220]: 100 mg | ORAL | @ 15:00:00 | NDC 00121174405

## 2022-04-24 MED ADMIN — HEPARIN, PORCINE (PF) 5,000 UNIT/0.5 ML IJ SYRG [95535]: 5000 [IU] | SUBCUTANEOUS | @ 12:00:00 | NDC 00409131611

## 2022-04-24 MED ADMIN — GUAIFENESIN 100 MG/5 ML PO LIQD [79220]: 100 mg | ORAL | @ 07:00:00 | NDC 00121174405

## 2022-04-24 MED ADMIN — MIDODRINE 5 MG PO TAB [10610]: 10 mg | ORAL | @ 04:00:00 | NDC 00904681861

## 2022-04-24 MED ADMIN — GUAIFENESIN 100 MG/5 ML PO LIQD [79220]: 100 mg | ORAL | @ 04:00:00 | NDC 00121174405

## 2022-04-24 MED ADMIN — AMIODARONE 200 MG PO TAB [9066]: 400 mg | ORAL | @ 14:00:00 | NDC 00904699361

## 2022-04-24 MED ADMIN — HEPARIN, PORCINE (PF) 5,000 UNIT/0.5 ML IJ SYRG [95535]: 5000 [IU] | SUBCUTANEOUS | @ 04:00:00 | NDC 00409131611

## 2022-04-24 MED ADMIN — TRAMADOL 50 MG PO TAB [14632]: 100 mg | ORAL | @ 04:00:00 | NDC 00904717961

## 2022-04-24 MED ADMIN — METHOCARBAMOL 500 MG PO TAB [4971]: 750 mg | ORAL | @ 14:00:00 | NDC 00904705761

## 2022-04-24 MED ADMIN — ACETAMINOPHEN 325 MG PO TAB [101]: 650 mg | ORAL | @ 12:00:00 | NDC 00904677361

## 2022-04-24 MED ADMIN — DIAZEPAM 5 MG PO TAB [2405]: 5 mg | ORAL | @ 07:00:00 | NDC 51079028501

## 2022-04-24 MED ADMIN — DIAZEPAM 5 MG PO TAB [2405]: 5 mg | ORAL | @ 12:00:00 | NDC 51079028501

## 2022-04-24 MED ADMIN — OXYCODONE 5 MG PO TAB [10814]: 5 mg | ORAL | @ 18:00:00 | NDC 00406055223

## 2022-04-24 MED ADMIN — ACETAMINOPHEN 325 MG PO TAB [101]: 650 mg | ORAL | @ 18:00:00 | NDC 00904677361

## 2022-04-24 MED ADMIN — GUAIFENESIN 100 MG/5 ML PO LIQD [79220]: 100 mg | ORAL | @ 22:00:00 | NDC 00121174405

## 2022-04-24 MED ADMIN — POTASSIUM CHLORIDE 20 MEQ PO TBTQ [35943]: 20 meq | ORAL | @ 12:00:00 | Stop: 2023-04-17 | NDC 00832532511

## 2022-04-24 MED ADMIN — TRAMADOL 50 MG PO TAB [14632]: 100 mg | ORAL | @ 10:00:00 | NDC 00904717961

## 2022-04-24 MED ADMIN — SENNOSIDES-DOCUSATE SODIUM 8.6-50 MG PO TAB [40926]: 2 | ORAL | @ 04:00:00 | NDC 00536124810

## 2022-04-24 MED ADMIN — FUROSEMIDE 40 MG PO TAB [3295]: 40 mg | ORAL | @ 14:00:00 | NDC 51079007301

## 2022-04-24 MED ADMIN — MIDODRINE 5 MG PO TAB [10610]: 10 mg | ORAL | @ 12:00:00 | NDC 00245021211

## 2022-04-24 MED ADMIN — ASPIRIN 81 MG PO CHEW [680]: 81 mg | ORAL | @ 15:00:00 | NDC 66553000201

## 2022-04-24 MED ADMIN — AMIODARONE 200 MG PO TAB [9066]: 400 mg | ORAL | @ 04:00:00 | NDC 00904699361

## 2022-04-25 ENCOUNTER — Inpatient Hospital Stay: Admit: 2022-04-25 | Discharge: 2022-04-25 | Payer: No Typology Code available for payment source

## 2022-04-25 MED ADMIN — ACETAMINOPHEN 325 MG PO TAB [101]: 650 mg | ORAL | @ 03:00:00 | NDC 00904677361

## 2022-04-25 MED ADMIN — METHOCARBAMOL 500 MG PO TAB [4971]: 750 mg | ORAL | @ 13:00:00 | NDC 00904705761

## 2022-04-25 MED ADMIN — HEPARIN, PORCINE (PF) 5,000 UNIT/0.5 ML IJ SYRG [95535]: 5000 [IU] | SUBCUTANEOUS | @ 03:00:00 | NDC 00409131611

## 2022-04-25 MED ADMIN — MIDODRINE 5 MG PO TAB [10610]: 10 mg | ORAL | @ 03:00:00 | NDC 00245021211

## 2022-04-25 MED ADMIN — HEPARIN, PORCINE (PF) 5,000 UNIT/0.5 ML IJ SYRG [95535]: 5000 [IU] | SUBCUTANEOUS | @ 21:00:00 | NDC 00409131611

## 2022-04-25 MED ADMIN — PIPERACILLIN-TAZOBACTAM 4.5 GRAM IV SOLR [80419]: 4.5 g | INTRAVENOUS | @ 16:00:00 | NDC 60505615900

## 2022-04-25 MED ADMIN — GUAIFENESIN 100 MG/5 ML PO LIQD [79220]: 100 mg | ORAL | @ 03:00:00 | NDC 00121174405

## 2022-04-25 MED ADMIN — SENNOSIDES-DOCUSATE SODIUM 8.6-50 MG PO TAB [40926]: 2 | ORAL | @ 03:00:00 | NDC 00536124810

## 2022-04-25 MED ADMIN — POLYETHYLENE GLYCOL 3350 17 GRAM PO PWPK [25424]: 34 g | ORAL | @ 15:00:00 | NDC 00904693186

## 2022-04-25 MED ADMIN — MIDODRINE 5 MG PO TAB [10610]: 10 mg | ORAL | @ 12:00:00 | NDC 00245021211

## 2022-04-25 MED ADMIN — GUAIFENESIN 100 MG/5 ML PO LIQD [79220]: 100 mg | ORAL | @ 15:00:00 | NDC 00121174405

## 2022-04-25 MED ADMIN — FUROSEMIDE 40 MG PO TAB [3295]: 40 mg | ORAL | @ 15:00:00 | NDC 51079007301

## 2022-04-25 MED ADMIN — ASPIRIN 81 MG PO CHEW [680]: 81 mg | ORAL | @ 15:00:00 | NDC 66553000201

## 2022-04-25 MED ADMIN — ACETAMINOPHEN 325 MG PO TAB [101]: 650 mg | ORAL | @ 22:00:00 | NDC 00904677361

## 2022-04-25 MED ADMIN — ACETAMINOPHEN 325 MG PO TAB [101]: 650 mg | ORAL | @ 15:00:00 | NDC 00904677361

## 2022-04-25 MED ADMIN — METHOCARBAMOL 500 MG PO TAB [4971]: 750 mg | ORAL | @ 03:00:00 | NDC 00904705761

## 2022-04-25 MED ADMIN — MIDODRINE 5 MG PO TAB [10610]: 10 mg | ORAL | @ 21:00:00 | NDC 00245021211

## 2022-04-25 MED ADMIN — PANTOPRAZOLE 40 MG PO TBEC [80436]: 40 mg | ORAL | @ 03:00:00 | NDC 00904647461

## 2022-04-25 MED ADMIN — METHOCARBAMOL 500 MG PO TAB [4971]: 750 mg | ORAL | @ 22:00:00 | NDC 00904705761

## 2022-04-25 MED ADMIN — MAGNESIUM HYDROXIDE 400 MG/5 ML PO SUSP [79944]: 30 mL | ORAL | @ 15:00:00 | NDC 00121043130

## 2022-04-25 MED ADMIN — MAGNESIUM HYDROXIDE 400 MG/5 ML PO SUSP [79944]: 30 mL | ORAL | @ 03:00:00 | NDC 00121043130

## 2022-04-25 MED ADMIN — SODIUM CHLORIDE 0.9 % IV PGBK (MB+) [95161]: 4.5 g | INTRAVENOUS | @ 21:00:00 | NDC 00338055318

## 2022-04-25 MED ADMIN — VANCOMYCIN 1.5G IN 0.9% NACL IVPB (BATCHED) [213737]: 1500 mg | INTRAVENOUS | @ 17:00:00 | NDC 54029433309

## 2022-04-25 MED ADMIN — SENNOSIDES-DOCUSATE SODIUM 8.6-50 MG PO TAB [40926]: 2 | ORAL | @ 15:00:00 | NDC 00536124810

## 2022-04-25 MED ADMIN — SODIUM CHLORIDE 0.9 % IV PGBK (MB+) [95161]: 4.5 g | INTRAVENOUS | @ 16:00:00 | NDC 00338915930

## 2022-04-25 MED ADMIN — AMIODARONE 200 MG PO TAB [9066]: 400 mg | ORAL | @ 03:00:00 | NDC 00904699361

## 2022-04-25 MED ADMIN — PIPERACILLIN-TAZOBACTAM 4.5 GRAM IV SOLR [80419]: 4.5 g | INTRAVENOUS | @ 21:00:00 | NDC 60505615900

## 2022-04-25 MED ADMIN — HEPARIN, PORCINE (PF) 5,000 UNIT/0.5 ML IJ SYRG [95535]: 5000 [IU] | SUBCUTANEOUS | @ 12:00:00 | NDC 00409131611

## 2022-04-25 MED ADMIN — AMIODARONE 200 MG PO TAB [9066]: 400 mg | ORAL | @ 15:00:00 | NDC 00904699361

## 2022-04-25 MED ADMIN — GUAIFENESIN 100 MG/5 ML PO LIQD [79220]: 100 mg | ORAL | @ 09:00:00 | NDC 00121174405

## 2022-04-25 MED ADMIN — GUAIFENESIN 100 MG/5 ML PO LIQD [79220]: 100 mg | ORAL | @ 22:00:00 | NDC 00121174405

## 2022-04-25 MED ADMIN — TRAMADOL 50 MG PO TAB [14632]: 50 mg | ORAL | @ 09:00:00 | NDC 00904717961

## 2022-04-25 MED ADMIN — GUAIFENESIN 100 MG/5 ML PO LIQD [79220]: 100 mg | ORAL | @ 19:00:00 | NDC 00121174405

## 2022-04-26 ENCOUNTER — Inpatient Hospital Stay: Admit: 2022-04-26 | Discharge: 2022-04-26 | Payer: No Typology Code available for payment source

## 2022-04-26 ENCOUNTER — Encounter: Admit: 2022-04-26 | Discharge: 2022-04-26 | Payer: No Typology Code available for payment source

## 2022-04-26 MED ADMIN — SODIUM CHLORIDE 0.9 % IV PGBK (MB+) [95161]: 4.5 g | INTRAVENOUS | @ 15:00:00 | NDC 00338915930

## 2022-04-26 MED ADMIN — PIPERACILLIN-TAZOBACTAM 4.5 GRAM IV SOLR [80419]: 4.5 g | INTRAVENOUS | @ 02:00:00 | NDC 60505615900

## 2022-04-26 MED ADMIN — GUAIFENESIN 100 MG/5 ML PO LIQD [79220]: 100 mg | ORAL | @ 15:00:00 | NDC 00121174405

## 2022-04-26 MED ADMIN — HEPARIN, PORCINE (PF) 5,000 UNIT/0.5 ML IJ SYRG [95535]: 5000 [IU] | SUBCUTANEOUS | @ 04:00:00 | NDC 00409131611

## 2022-04-26 MED ADMIN — SODIUM CHLORIDE 0.9 % IV PGBK (MB+) [95161]: 4.5 g | INTRAVENOUS | @ 09:00:00 | NDC 00338915930

## 2022-04-26 MED ADMIN — SODIUM CHLORIDE 0.9 % IV PGBK (MB+) [95161]: 4.5 g | INTRAVENOUS | @ 19:00:00 | NDC 00338915930

## 2022-04-26 MED ADMIN — AMIODARONE 200 MG PO TAB [9066]: 400 mg | ORAL | @ 15:00:00 | NDC 00904699361

## 2022-04-26 MED ADMIN — ACETAMINOPHEN 325 MG PO TAB [101]: 650 mg | ORAL | @ 06:00:00 | NDC 00904677361

## 2022-04-26 MED ADMIN — OXYCODONE 5 MG PO TAB [10814]: 10 mg | ORAL | @ 21:00:00 | NDC 00406055223

## 2022-04-26 MED ADMIN — SENNOSIDES-DOCUSATE SODIUM 8.6-50 MG PO TAB [40926]: 2 | ORAL | @ 15:00:00 | NDC 00536124810

## 2022-04-26 MED ADMIN — PIPERACILLIN-TAZOBACTAM 4.5 GRAM IV SOLR [80419]: 4.5 g | INTRAVENOUS | @ 19:00:00 | NDC 60505615900

## 2022-04-26 MED ADMIN — SODIUM CHLORIDE 0.9 % IV PGBK (MB+) [95161]: 4.5 g | INTRAVENOUS | @ 02:00:00 | NDC 00338055318

## 2022-04-26 MED ADMIN — METHOCARBAMOL 500 MG PO TAB [4971]: 750 mg | ORAL | @ 06:00:00 | NDC 00904705761

## 2022-04-26 MED ADMIN — AMIODARONE 200 MG PO TAB [9066]: 400 mg | ORAL | @ 02:00:00 | NDC 00904699361

## 2022-04-26 MED ADMIN — PIPERACILLIN-TAZOBACTAM 4.5 GRAM IV SOLR [80419]: 4.5 g | INTRAVENOUS | @ 15:00:00 | NDC 60505615900

## 2022-04-26 MED ADMIN — MIDODRINE 5 MG PO TAB [10610]: 10 mg | ORAL | @ 04:00:00 | NDC 00245021211

## 2022-04-26 MED ADMIN — GUAIFENESIN 100 MG/5 ML PO LIQD [79220]: 100 mg | ORAL | @ 02:00:00 | NDC 00121174405

## 2022-04-26 MED ADMIN — MAGNESIUM HYDROXIDE 400 MG/5 ML PO SUSP [79944]: 30 mL | ORAL | @ 15:00:00 | NDC 00121043130

## 2022-04-26 MED ADMIN — HEPARIN, PORCINE (PF) 5,000 UNIT/0.5 ML IJ SYRG [95535]: 5000 [IU] | SUBCUTANEOUS | @ 13:00:00 | NDC 00409131611

## 2022-04-26 MED ADMIN — GUAIFENESIN 100 MG/5 ML PO LIQD [79220]: 100 mg | ORAL | @ 19:00:00 | NDC 00121174405

## 2022-04-26 MED ADMIN — PIPERACILLIN-TAZOBACTAM 4.5 GRAM IV SOLR [80419]: 4.5 g | INTRAVENOUS | @ 09:00:00 | NDC 60505615900

## 2022-04-26 MED ADMIN — POTASSIUM CHLORIDE 20 MEQ PO TBTQ [35943]: 20 meq | ORAL | @ 13:00:00 | Stop: 2023-04-17 | NDC 00832532511

## 2022-04-26 MED ADMIN — PANTOPRAZOLE 40 MG PO TBEC [80436]: 40 mg | ORAL | @ 02:00:00 | NDC 00904647461

## 2022-04-26 MED ADMIN — OXYCODONE 5 MG PO TAB [10814]: 5 mg | ORAL | @ 09:00:00 | NDC 00406055223

## 2022-04-26 MED ADMIN — MIDODRINE 5 MG PO TAB [10610]: 10 mg | ORAL | @ 19:00:00 | NDC 00245021211

## 2022-04-26 MED ADMIN — ACETAMINOPHEN 325 MG PO TAB [101]: 650 mg | ORAL | @ 21:00:00 | NDC 00904677361

## 2022-04-26 MED ADMIN — MIDODRINE 5 MG PO TAB [10610]: 10 mg | ORAL | @ 13:00:00 | NDC 00245021211

## 2022-04-26 MED ADMIN — ASPIRIN 81 MG PO CHEW [680]: 81 mg | ORAL | @ 15:00:00 | NDC 66553000201

## 2022-04-26 MED ADMIN — HEPARIN, PORCINE (PF) 5,000 UNIT/0.5 ML IJ SYRG [95535]: 5000 [IU] | SUBCUTANEOUS | @ 19:00:00 | NDC 00409131611

## 2022-04-27 ENCOUNTER — Inpatient Hospital Stay: Admit: 2022-04-27 | Discharge: 2022-04-27 | Payer: No Typology Code available for payment source

## 2022-04-27 ENCOUNTER — Encounter: Admit: 2022-04-27 | Discharge: 2022-04-27 | Payer: No Typology Code available for payment source

## 2022-04-27 MED ADMIN — AMIODARONE 200 MG PO TAB [9066]: 400 mg | ORAL | @ 14:00:00 | Stop: 2022-04-27 | NDC 00904699361

## 2022-04-27 MED ADMIN — AZITHROMYCIN 250 MG PO TAB [20943]: 500 mg | ORAL | @ 17:00:00 | Stop: 2022-04-27 | NDC 00904735006

## 2022-04-27 MED ADMIN — MIDODRINE 5 MG PO TAB [10610]: 10 mg | ORAL | @ 12:00:00 | Stop: 2022-04-27 | NDC 00245021211

## 2022-04-27 MED ADMIN — ASPIRIN 81 MG PO CHEW [680]: 81 mg | ORAL | @ 14:00:00 | Stop: 2022-04-27 | NDC 66553000201

## 2022-04-27 MED ADMIN — EUCALYPTUS-MENTHOL MM LOZG [83613]: 1 | ORAL | NDC 12546062970

## 2022-04-27 MED ADMIN — HEPARIN, PORCINE (PF) 5,000 UNIT/0.5 ML IJ SYRG [95535]: 5000 [IU] | SUBCUTANEOUS | @ 12:00:00 | Stop: 2022-04-27 | NDC 00409131611

## 2022-04-27 MED ADMIN — AMIODARONE 200 MG PO TAB [9066]: 400 mg | ORAL | @ 03:00:00 | NDC 00904699361

## 2022-04-27 MED ADMIN — SODIUM CHLORIDE 0.9 % IV PGBK (MB+) [95161]: 4.5 g | INTRAVENOUS | @ 02:00:00 | NDC 00338915930

## 2022-04-27 MED ADMIN — PIPERACILLIN-TAZOBACTAM 4.5 GRAM IV SOLR [80419]: 4.5 g | INTRAVENOUS | @ 09:00:00 | Stop: 2022-04-27 | NDC 60505615900

## 2022-04-27 MED ADMIN — PIPERACILLIN-TAZOBACTAM 4.5 GRAM IV SOLR [80419]: 4.5 g | INTRAVENOUS | @ 14:00:00 | Stop: 2022-04-27 | NDC 60505615900

## 2022-04-27 MED ADMIN — SODIUM CHLORIDE 0.9 % IV PGBK (MB+) [95161]: 4.5 g | INTRAVENOUS | @ 09:00:00 | Stop: 2022-04-27 | NDC 00338915930

## 2022-04-27 MED ADMIN — PANTOPRAZOLE 40 MG PO TBEC [80436]: 40 mg | ORAL | @ 03:00:00 | NDC 00904647461

## 2022-04-27 MED ADMIN — SODIUM CHLORIDE 0.9 % IV PGBK (MB+) [95161]: 4.5 g | INTRAVENOUS | @ 14:00:00 | Stop: 2022-04-27 | NDC 00338915930

## 2022-04-27 MED ADMIN — METHOCARBAMOL 500 MG PO TAB [4971]: 750 mg | ORAL | @ 03:00:00 | NDC 00904705761

## 2022-04-27 MED ADMIN — MIDODRINE 5 MG PO TAB [10610]: 10 mg | ORAL | @ 05:00:00 | NDC 00245021211

## 2022-04-27 MED ADMIN — PIPERACILLIN-TAZOBACTAM 4.5 GRAM IV SOLR [80419]: 4.5 g | INTRAVENOUS | @ 02:00:00 | NDC 60505615900

## 2022-04-27 MED ADMIN — HEPARIN, PORCINE (PF) 5,000 UNIT/0.5 ML IJ SYRG [95535]: 5000 [IU] | SUBCUTANEOUS | @ 05:00:00 | NDC 00409131611

## 2022-04-27 MED FILL — AZITHROMYCIN 500 MG PO TAB: 500 mg | 4 days supply | Qty: 4 | Fill #1 | Status: CP

## 2022-04-27 MED FILL — OXYCODONE-ACETAMINOPHEN 5-325 MG PO TAB: 5-325 mg | ORAL | 4 days supply | Qty: 30 | Fill #1 | Status: CP

## 2022-04-27 MED FILL — ASPIRIN 81 MG PO CHEW: 81 mg | ORAL | 30 days supply | Qty: 30 | Fill #1 | Status: CP

## 2022-04-28 ENCOUNTER — Encounter: Admit: 2022-04-28 | Discharge: 2022-04-28 | Payer: No Typology Code available for payment source

## 2022-04-28 MED ORDER — MIDODRINE 10 MG PO TAB
10 mg | ORAL_TABLET | ORAL | 0 refills | Status: AC | PRN
Start: 2022-04-28 — End: ?
  Filled 2022-04-27: qty 90, 30d supply, fill #1

## 2022-04-28 NOTE — Telephone Encounter
Received a page from [facility] - reporting patient has questions about medication prescribed       04/28/22 3:20 PM  I spoke with patient - she reported she was discharged yesterday from the hospital after having mitral valve repair from Dr. Azzie Glatter on 04/17/2022. Patient mentioned she was discharged with midodrine and BP has been running 130's/70's. Patient has been holding midodrine today, but is unsure how to proceed with taking medication going forward.   Patient currently has no postoperative appointment setup. Patient mentioned she was advised from discharging team that someone will contact her tomorrow.     Paged Dr. Domingo Cocking (CVM weekend on call):   "Hold midodrine , transition to PRN, only take if blood pressure less than 794 systolic"       8/0/1655 3:74 PM   Phone lined dropped with patient - attempted to call back but was forwarded to voicemail.  I left in voicemail message for patient  to continue to hold midodrine. Informed patient to transition midodrine to take as needed for BP less than 827 systolic.     Edited patient medications list. Will forward to CTS team regarding postoperative appointment for CTS

## 2022-04-29 ENCOUNTER — Encounter: Admit: 2022-04-29 | Discharge: 2022-04-29 | Payer: No Typology Code available for payment source

## 2022-04-30 ENCOUNTER — Encounter: Admit: 2022-04-30 | Discharge: 2022-04-30 | Payer: No Typology Code available for payment source

## 2022-04-30 NOTE — Telephone Encounter
Attempted to call pt for hospital f/u. Sent to VM. Left 24 hour CTS clinic number for call back with any questions/concerns.

## 2022-05-05 ENCOUNTER — Encounter: Admit: 2022-05-05 | Discharge: 2022-05-05 | Payer: No Typology Code available for payment source

## 2022-05-23 ENCOUNTER — Ambulatory Visit: Admit: 2022-05-23 | Discharge: 2022-05-23 | Payer: No Typology Code available for payment source

## 2022-05-23 ENCOUNTER — Encounter: Admit: 2022-05-23 | Discharge: 2022-05-23 | Payer: No Typology Code available for payment source

## 2022-05-23 DIAGNOSIS — J45909 Unspecified asthma, uncomplicated: Secondary | ICD-10-CM

## 2022-05-23 DIAGNOSIS — Z9889 Other specified postprocedural states: Secondary | ICD-10-CM

## 2022-05-23 DIAGNOSIS — K289 Gastrojejunal ulcer, unspecified as acute or chronic, without hemorrhage or perforation: Secondary | ICD-10-CM

## 2022-05-23 DIAGNOSIS — K319 Disease of stomach and duodenum, unspecified: Secondary | ICD-10-CM

## 2022-05-23 DIAGNOSIS — J449 Chronic obstructive pulmonary disease, unspecified: Secondary | ICD-10-CM

## 2022-05-23 DIAGNOSIS — M549 Dorsalgia, unspecified: Secondary | ICD-10-CM

## 2022-07-10 ENCOUNTER — Inpatient Hospital Stay: Payer: No Typology Code available for payment source

## 2022-07-10 ENCOUNTER — Encounter: Admit: 2022-07-10 | Discharge: 2022-07-10 | Payer: No Typology Code available for payment source

## 2022-07-11 ENCOUNTER — Inpatient Hospital Stay: Admit: 2022-07-11 | Discharge: 2022-07-11 | Payer: No Typology Code available for payment source

## 2022-07-11 ENCOUNTER — Encounter: Admit: 2022-07-11 | Discharge: 2022-07-11 | Payer: No Typology Code available for payment source

## 2022-07-11 MED ORDER — PROPOFOL INJ 10 MG/ML IV VIAL
INTRAVENOUS | 0 refills | Status: DC
Start: 2022-07-11 — End: 2022-07-11

## 2022-07-11 MED ORDER — SODIUM CHLORIDE 0.9 % IV SOLP (OR) 500ML
INTRAVENOUS | 0 refills | Status: DC
Start: 2022-07-11 — End: 2022-07-11

## 2022-07-11 MED ORDER — PROPOFOL 10 MG/ML IV EMUL 50 ML (INFUSION)(AM)(OR)
INTRAVENOUS | 0 refills | Status: DC
Start: 2022-07-11 — End: 2022-07-11

## 2022-07-11 MED ORDER — PHENYLEPHRINE HCL IN 0.9% NACL 1 MG/10 ML (100 MCG/ML) IV SYRG
INTRAVENOUS | 0 refills | Status: DC
Start: 2022-07-11 — End: 2022-07-11

## 2022-07-11 MED ORDER — LIDOCAINE (PF) 20 MG/ML (2 %) IJ SOLN
INTRAVENOUS | 0 refills | Status: DC
Start: 2022-07-11 — End: 2022-07-11

## 2022-07-11 MED ORDER — VASOPRESSIN 20 UNITS/20ML SYR (1 UNIT/ML) (AN) (OSM)
INTRAVENOUS | 0 refills | Status: DC
Start: 2022-07-11 — End: 2022-07-11

## 2022-07-11 MED ADMIN — SODIUM CHLORIDE 0.9 % IV PGBK (MB+) [95161]: 4.5 g | INTRAVENOUS | @ 09:00:00 | Stop: 2022-07-11 | NDC 00338915930

## 2022-07-11 MED ADMIN — IBUPROFEN 200 MG PO TAB [3841]: 200 mg | ORAL | @ 23:00:00 | NDC 00904791461

## 2022-07-11 MED ADMIN — COLCHICINE 0.6 MG PO TAB [1821]: 0.6 mg | ORAL | @ 18:00:00 | NDC 70010000201

## 2022-07-11 MED ADMIN — SODIUM CHLORIDE 0.9 % IV SOLP [27838]: 250 mL | INTRAVENOUS | @ 09:00:00 | Stop: 2022-07-11 | NDC 00338004902

## 2022-07-11 MED ADMIN — DAPAGLIFLOZIN PROPANEDIOL 10 MG PO TAB [320172]: 10 mg | ORAL | @ 23:00:00 | NDC 00310621039

## 2022-07-11 MED ADMIN — SODIUM CHLORIDE 0.9 % IV PGBK (MB+) [95161]: 4.5 g | INTRAVENOUS | @ 13:00:00 | Stop: 2022-07-11 | NDC 00338915930

## 2022-07-11 MED ADMIN — PIPERACILLIN-TAZOBACTAM 4.5 GRAM IV SOLR [80419]: 4.5 g | INTRAVENOUS | @ 19:00:00 | NDC 60505615900

## 2022-07-11 MED ADMIN — PIPERACILLIN-TAZOBACTAM 4.5 GRAM IV SOLR [80419]: 4.5 g | INTRAVENOUS | @ 09:00:00 | Stop: 2022-07-11 | NDC 60505615900

## 2022-07-11 MED ADMIN — EPLERENONE 25 MG PO TAB [86804]: 12.5 mg | ORAL | @ 18:00:00 | NDC 66993034330

## 2022-07-11 MED ADMIN — MAGNESIUM SULFATE IN D5W 1 GRAM/100 ML IV PGBK [166578]: 1 g | INTRAVENOUS | @ 13:00:00 | Stop: 2022-07-11 | NDC 00409672723

## 2022-07-11 MED ADMIN — SODIUM CHLORIDE 0.9 % IV PGBK (MB+) [95161]: 4.5 g | INTRAVENOUS | @ 19:00:00 | NDC 00338915930

## 2022-07-11 MED ADMIN — BUMETANIDE 1 MG PO TAB [9310]: 1 mg | ORAL | @ 23:00:00 | NDC 00904701606

## 2022-07-11 MED ADMIN — VALSARTAN 40 MG PO TAB [86694]: 40 mg | ORAL | @ 18:00:00 | NDC 43547036703

## 2022-07-11 MED ADMIN — PIPERACILLIN-TAZOBACTAM 4.5 GRAM IV SOLR [80419]: 4.5 g | INTRAVENOUS | @ 13:00:00 | Stop: 2022-07-11 | NDC 60505615900

## 2022-07-11 MED ADMIN — SODIUM CHLORIDE 0.9 % IJ SOLN [7319]: 10 mL | INTRAVENOUS | @ 12:00:00 | Stop: 2022-07-11 | NDC 00409488820

## 2022-07-11 MED ADMIN — POTASSIUM CHLORIDE 20 MEQ PO TBTQ [35943]: 20 meq | ORAL | @ 13:00:00 | Stop: 2022-07-11 | NDC 00832532510

## 2022-07-11 MED ADMIN — PERFLUTREN LIPID MICROSPHERES 1.1 MG/ML IV SUSP [79178]: 2 mL | INTRAVENOUS | @ 12:00:00 | Stop: 2022-07-11 | NDC 11994001116

## 2022-07-11 MED ADMIN — ASPIRIN 81 MG PO CHEW [680]: 81 mg | ORAL | @ 13:00:00 | NDC 00904679430

## 2022-07-11 MED ADMIN — IBUPROFEN 200 MG PO TAB [3841]: 200 mg | ORAL | @ 18:00:00 | NDC 00904791461

## 2022-07-11 NOTE — Anesthesia Post-Procedure Evaluation
Post-Anesthesia Evaluation    Name: Katelyn Wu      MRN: [id number]     DOB: 02/26/1971     Age: 52 y.o.     Sex: female   __________________________________________________________________________     Procedure Information       Anesthesia Start Date/Time: 07/11/22 1510    Scheduled providers: Lucinda Dell, MD; Hajj, Franchot Mimes, MD; Lovenia Kim, RN    Procedure: TRANSESOPHAGEAL ECHO    Location: [facility]  BP: [id number] (03/15 1555)  Pulse: 88 (03/15 1555)  Respirations: 26 PER MINUTE (03/15 1555)  SpO2: 94 % (03/15 1555)  O2 Device: Nasal cannula (03/15 1555)  Height: 170.2 cm (5\' 7" ) (03/15 1553)   Vitals Value Taken Time   BP 96/73 07/11/22 1555   Temp     Pulse 88 07/11/22 1555   Respirations 26 PER MINUTE 07/11/22 1555   SpO2 94 % 07/11/22 1555   O2 Device Nasal cannula 07/11/22 1555   ABP     ART BP           Post Anesthesia Evaluation Note    Evaluation location: other  Patient participation: recovered; patient participated in evaluation    Pain score: 0  Pain management: adequate    Hydration: normovolemia  Airway patency: adequate    Perioperative Events      Postoperative Status  Cardiovascular status: hemodynamically stable  Respiratory status: spontaneous ventilation (will send her on 3 liters NC O2 because of her preprocedure dyspnea, post-procedure tachypnea and sedation given)  Additional comments: I've asked her nurse to arrange SpO2 monitoring for this evening for her dyspnea & tachypnea on supplemental O2.        Perioperative Events  There were no known complications for this encounter.

## 2022-07-12 MED ADMIN — PIPERACILLIN-TAZOBACTAM 4.5 GRAM IV SOLR [80419]: 4.5 g | INTRAVENOUS | @ 13:00:00 | Stop: 2022-07-12 | NDC 60505615900

## 2022-07-12 MED ADMIN — PIPERACILLIN-TAZOBACTAM 4.5 GRAM IV SOLR [80419]: 4.5 g | INTRAVENOUS | @ 01:00:00 | NDC 60505615900

## 2022-07-12 MED ADMIN — CARVEDILOL 3.125 MG PO TAB [79317]: 3.125 mg | ORAL | @ 15:00:00 | NDC 00904630061

## 2022-07-12 MED ADMIN — GUAIFENESIN 600 MG PO TA12 [321343]: 600 mg | ORAL | @ 13:00:00 | NDC 68084057211

## 2022-07-12 MED ADMIN — COLCHICINE 0.6 MG PO TAB [1821]: 0.6 mg | ORAL | @ 13:00:00 | NDC 70010000201

## 2022-07-12 MED ADMIN — COLCHICINE 0.6 MG PO TAB [1821]: 0.6 mg | ORAL | @ 02:00:00 | NDC 70010000201

## 2022-07-12 MED ADMIN — IBUPROFEN 200 MG PO TAB [3841]: 200 mg | ORAL | @ 13:00:00 | Stop: 2022-07-12 | NDC 00904791461

## 2022-07-12 MED ADMIN — SODIUM CHLORIDE 0.9 % IV SOLP [27838]: 300 mg | INTRAVENOUS | @ 19:00:00 | Stop: 2022-07-15 | NDC 00338004902

## 2022-07-12 MED ADMIN — BUMETANIDE 1 MG PO TAB [9310]: 1 mg | ORAL | @ 13:00:00 | NDC 00904701606

## 2022-07-12 MED ADMIN — VALSARTAN 40 MG PO TAB [86694]: 40 mg | ORAL | @ 13:00:00 | NDC 43547036703

## 2022-07-12 MED ADMIN — DOXYCYCLINE HYCLATE 100 MG PO TAB [2625]: 100 mg | ORAL | @ 17:00:00 | NDC 00143211205

## 2022-07-12 MED ADMIN — EPLERENONE 25 MG PO TAB [86804]: 12.5 mg | ORAL | @ 15:00:00 | NDC 66993034330

## 2022-07-12 MED ADMIN — SODIUM CHLORIDE 0.9 % IV PGBK (MB+) [95161]: 4.5 g | INTRAVENOUS | @ 07:00:00 | Stop: 2022-07-12 | NDC 00338915930

## 2022-07-12 MED ADMIN — SODIUM CHLORIDE 0.9 % IV PGBK (MB+) [95161]: 4.5 g | INTRAVENOUS | @ 13:00:00 | Stop: 2022-07-12 | NDC 00338915930

## 2022-07-12 MED ADMIN — ASPIRIN 81 MG PO CHEW [680]: 81 mg | ORAL | @ 13:00:00 | NDC 00904679430

## 2022-07-12 MED ADMIN — GUAIFENESIN 600 MG PO TA12 [321343]: 600 mg | ORAL | @ 01:00:00 | NDC 68084057211

## 2022-07-12 MED ADMIN — IRON SUCROSE 100 MG IRON/5 ML IV SOLN GROUP [280019]: 300 mg | INTRAVENOUS | @ 19:00:00 | Stop: 2022-07-15 | NDC 00517234001

## 2022-07-12 MED ADMIN — BUMETANIDE 1 MG PO TAB [9310]: 1 mg | ORAL | @ 23:00:00 | NDC 00904701606

## 2022-07-12 MED ADMIN — POLYETHYLENE GLYCOL 3350 17 GRAM PO PWPK [25424]: 34 g | ORAL | @ 17:00:00 | NDC 00904693186

## 2022-07-12 MED ADMIN — PIPERACILLIN-TAZOBACTAM 4.5 GRAM IV SOLR [80419]: 4.5 g | INTRAVENOUS | @ 07:00:00 | Stop: 2022-07-12 | NDC 60505615900

## 2022-07-12 MED ADMIN — SODIUM CHLORIDE 0.9 % IV PGBK (MB+) [95161]: 4.5 g | INTRAVENOUS | @ 01:00:00 | NDC 00338915930

## 2022-07-13 ENCOUNTER — Encounter: Admit: 2022-07-13 | Discharge: 2022-07-13 | Payer: No Typology Code available for payment source

## 2022-07-13 MED ADMIN — CARVEDILOL 3.125 MG PO TAB [79317]: 3.125 mg | ORAL | @ 01:00:00 | NDC 00904630061

## 2022-07-13 MED ADMIN — EPLERENONE 25 MG PO TAB [86804]: 12.5 mg | ORAL | @ 13:00:00 | Stop: 2022-07-13 | NDC 66993034330

## 2022-07-13 MED ADMIN — DOXYCYCLINE HYCLATE 100 MG PO TAB [2625]: 100 mg | ORAL | @ 01:00:00 | NDC 00143211205

## 2022-07-13 MED ADMIN — IRON SUCROSE 100 MG IRON/5 ML IV SOLN GROUP [280019]: 300 mg | INTRAVENOUS | @ 13:00:00 | Stop: 2022-07-13 | NDC 00517234001

## 2022-07-13 MED ADMIN — ASPIRIN 81 MG PO CHEW [680]: 81 mg | ORAL | @ 13:00:00 | Stop: 2022-07-13 | NDC 00904679430

## 2022-07-13 MED ADMIN — DOXYCYCLINE HYCLATE 100 MG PO TAB [2625]: 100 mg | ORAL | @ 13:00:00 | Stop: 2022-07-13 | NDC 00143211205

## 2022-07-13 MED ADMIN — IBUPROFEN 200 MG PO TAB [3841]: 200 mg | ORAL | @ 02:00:00 | NDC 00904791461

## 2022-07-13 MED ADMIN — GUAIFENESIN 600 MG PO TA12 [321343]: 600 mg | ORAL | @ 13:00:00 | Stop: 2022-07-13 | NDC 68084057211

## 2022-07-13 MED ADMIN — GUAIFENESIN 600 MG PO TA12 [321343]: 600 mg | ORAL | @ 01:00:00 | NDC 68084057211

## 2022-07-13 MED ADMIN — SODIUM CHLORIDE 0.9 % IV SOLP [27838]: 300 mg | INTRAVENOUS | @ 13:00:00 | Stop: 2022-07-13 | NDC 00338004902

## 2022-07-13 MED ADMIN — VALSARTAN 40 MG PO TAB [86694]: 40 mg | ORAL | @ 13:00:00 | Stop: 2022-07-13 | NDC 43547036703

## 2022-07-13 MED ADMIN — COLCHICINE 0.6 MG PO TAB [1821]: 0.6 mg | ORAL | @ 02:00:00 | NDC 70010000201

## 2022-07-13 MED ADMIN — ACETAMINOPHEN 325 MG PO TAB [101]: 650 mg | ORAL | @ 01:00:00 | NDC 00904677361

## 2022-07-13 MED ADMIN — IBUPROFEN 200 MG PO TAB [3841]: 200 mg | ORAL | @ 13:00:00 | Stop: 2022-07-13 | NDC 00904791461

## 2022-07-13 MED ADMIN — COLCHICINE 0.6 MG PO TAB [1821]: 0.6 mg | ORAL | @ 13:00:00 | Stop: 2022-07-13 | NDC 70010000201

## 2022-07-13 MED ADMIN — CARVEDILOL 3.125 MG PO TAB [79317]: 3.125 mg | ORAL | @ 13:00:00 | Stop: 2022-07-13 | NDC 00904630061

## 2022-07-13 MED ADMIN — POTASSIUM CHLORIDE 20 MEQ PO TBTQ [35943]: 40 meq | ORAL | @ 12:00:00 | Stop: 2022-07-13 | NDC 00832532510

## 2022-07-13 MED FILL — COLCHICINE 0.6 MG PO TAB: 0.6 mg | ORAL | 30 days supply | Qty: 60 | Fill #1 | Status: CP

## 2022-07-13 MED FILL — CARVEDILOL 3.125 MG PO TAB: 3.125 mg | ORAL | 30 days supply | Qty: 60 | Fill #1 | Status: CP

## 2022-07-13 MED FILL — DOXYCYCLINE HYCLATE 100 MG PO TAB: 100 mg | ORAL | 3 days supply | Qty: 6 | Fill #1 | Status: CP

## 2022-07-13 MED FILL — BUMETANIDE 1 MG PO TAB: 1 mg | ORAL | 30 days supply | Qty: 60 | Fill #1 | Status: CP

## 2022-07-13 MED FILL — EPLERENONE 25 MG PO TAB: 25 mg | ORAL | 30 days supply | Qty: 15 | Fill #1 | Status: CP

## 2022-07-13 MED FILL — VALSARTAN 40 MG PO TAB: 40 mg | ORAL | 30 days supply | Qty: 30 | Fill #1 | Status: CP

## 2022-07-14 ENCOUNTER — Encounter: Admit: 2022-07-14 | Discharge: 2022-07-14 | Payer: No Typology Code available for payment source

## 2022-07-14 NOTE — Telephone Encounter
Patient Discharge Date from hospital: 07/13/22  Date Call Attempted: 07/14/22  Number of Attempts: 1  Date Call Completed:      Call placed to pt for 72 hour post hospital follow up call. LM on authorized VM requesting cb. Pt does not authorize [place] to speak with anyone else. MyChart message sent.     Two Patient Identifier complete: Yes []     Next Appointment    Next follow-up appointment on  07/17/2022 at 0900 with Ladona Horns, APRN was cancelled by pt through [place]. [place] in [place]     Transportation    Does pt have transportation?  Yes []     No []    NA []      Home Health    No     Medications    Does pt have all medications? Yes  []     No []       START taking:  bumetanide (BUMEX)  carvediloL (COREG)  colchicine (COLCRYS)  doxycycline hyclate (VIBRACIN)  eplerenone (INSPRA)  ferrous sulfate (Iron)  ibuprofen (ADVIL)  valsartan (DIOVAN)  STOP taking:  predniSONE 10 mg tablet (DELTASONE)    Diet    200 mg cholesterol, 2 G Na, 2 L fluid restriction     Is patient following prescribed diet and restrictions?  Yes []    No []      Scale/Weight    Does pt have a scale at home?  Yes []    No []     Did pt weight first thing this morning?  Yes []    No []      If yes, what was pt's first morning weight today?      Signs and Symptoms    Pt reports the following symptoms:           Pt verbalized understanding of signs and symptoms of HF and when to contact a provider or seek immediate assistance at the ER.    Was pt given zone sheet? Yes [x]   No []     Intervention(s)          Plan of Care    Continued education needed for heart failure symptom management and when to contact our office.

## 2022-07-14 NOTE — Telephone Encounter
Patient Discharge Date from hospital: 07/13/22  Date Call Attempted: 07/14/22  Number of Attempts: 1  Date Call Completed:       Call placed to pt for 72 hour post hospital follow up call. LM on authorized VM requesting cb. Pt does not authorize [place] to speak with anyone else. MyChart message has also been sent.      Two Patient Identifier complete: Yes []      Next Appointment     Next follow-up appointment on  07/17/2022 at 0900 with Ladona Horns, APRN was cancelled by pt through [place]. [place] lives in [place].      Transportation     Does pt have transportation?  Yes []     No []    NA []                  Home Health     No      Medications     Does pt have all medications? Yes  []     No []         START taking:  bumetanide (BUMEX)  carvediloL (COREG)  colchicine (COLCRYS)  doxycycline hyclate (VIBRACIN)  eplerenone (INSPRA)  ferrous sulfate (Iron)  ibuprofen (ADVIL)  valsartan (DIOVAN)  STOP taking:  predniSONE 10 mg tablet (DELTASONE)     Diet     200 mg cholesterol, 2 G Na, 2 L fluid restriction      Is patient following prescribed diet and restrictions?  Yes []    No []       Scale/Weight     Does pt have a scale at home?  Yes []    No []      Did pt weight first thing this morning?  Yes []    No []       If yes, what was pt's first morning weight today?       Signs and Symptoms     Pt reports the following symptoms:               Pt verbalized understanding of signs and symptoms of HF and when to contact a provider or seek immediate assistance at the ER.     Was pt given zone sheet? Yes [x]   No []      Intervention(s)              Plan of Care     Continued education needed for heart failure symptom management and when to contact our office.

## 2022-07-17 ENCOUNTER — Encounter: Admit: 2022-07-17 | Discharge: 2022-07-17 | Payer: No Typology Code available for payment source

## 2022-07-24 ENCOUNTER — Encounter: Admit: 2022-07-24 | Discharge: 2022-07-24 | Payer: No Typology Code available for payment source

## 2022-07-24 DIAGNOSIS — I5032 Chronic diastolic (congestive) heart failure: Secondary | ICD-10-CM

## 2022-07-24 DIAGNOSIS — I5030 Unspecified diastolic (congestive) heart failure: Secondary | ICD-10-CM

## 2022-07-24 LAB — BASIC METABOLIC PANEL
ANION GAP: 12
ANION GAP: 12
BLD UREA NITROGEN: 11
BLD UREA NITROGEN: 11
CALCIUM: 9
CALCIUM: 9
CHLORIDE: 106
CHLORIDE: 106
CO2: 25
CO2: 25
CREATININE: 0.7
CREATININE: 0.7
GFR ESTIMATED AFRICAN AMERICAN: 88
GFR ESTIMATED: 88
GLUCOSE,PANEL: 112 — ABNORMAL HIGH
GLUCOSE,PANEL: 112 — ABNORMAL HIGH (ref 70–105)
POTASSIUM: 3.9
POTASSIUM: 3.9
SODIUM: 143
SODIUM: 143

## 2022-07-25 ENCOUNTER — Encounter: Admit: 2022-07-25 | Discharge: 2022-07-25 | Payer: No Typology Code available for payment source

## 2022-07-25 DIAGNOSIS — I5032 Chronic diastolic (congestive) heart failure: Secondary | ICD-10-CM

## 2022-07-31 ENCOUNTER — Encounter: Admit: 2022-07-31 | Discharge: 2022-07-31 | Payer: No Typology Code available for payment source

## 2022-07-31 NOTE — Research Notes
Called and left voicemail to update patient about her pre-eligibility status for Altflow 2 trial

## 2022-08-04 ENCOUNTER — Encounter: Admit: 2022-08-04 | Discharge: 2022-08-04 | Payer: No Typology Code available for payment source

## 2022-08-05 ENCOUNTER — Encounter: Admit: 2022-08-05 | Discharge: 2022-08-05 | Payer: No Typology Code available for payment source

## 2022-08-07 ENCOUNTER — Encounter: Admit: 2022-08-07 | Discharge: 2022-08-07 | Payer: No Typology Code available for payment source

## 2022-08-07 DIAGNOSIS — R0989 Other specified symptoms and signs involving the circulatory and respiratory systems: Secondary | ICD-10-CM

## 2022-08-07 DIAGNOSIS — J449 Chronic obstructive pulmonary disease, unspecified: Secondary | ICD-10-CM

## 2022-08-07 DIAGNOSIS — I5033 Acute on chronic diastolic (congestive) heart failure: Secondary | ICD-10-CM

## 2022-08-07 DIAGNOSIS — I5043 Acute on chronic combined systolic (congestive) and diastolic (congestive) heart failure: Secondary | ICD-10-CM

## 2022-08-07 DIAGNOSIS — Z9889 Other specified postprocedural states: Secondary | ICD-10-CM

## 2022-08-07 DIAGNOSIS — R002 Palpitations: Secondary | ICD-10-CM

## 2022-08-07 DIAGNOSIS — I5032 Chronic diastolic (congestive) heart failure: Secondary | ICD-10-CM

## 2022-08-07 DIAGNOSIS — K289 Gastrojejunal ulcer, unspecified as acute or chronic, without hemorrhage or perforation: Secondary | ICD-10-CM

## 2022-08-07 DIAGNOSIS — J45909 Unspecified asthma, uncomplicated: Secondary | ICD-10-CM

## 2022-08-07 DIAGNOSIS — I3139 Pericardial effusion: Secondary | ICD-10-CM

## 2022-08-07 DIAGNOSIS — R0602 Shortness of breath: Secondary | ICD-10-CM

## 2022-08-07 DIAGNOSIS — J189 Pneumonia, unspecified organism: Secondary | ICD-10-CM

## 2022-08-07 DIAGNOSIS — I5040 Unspecified combined systolic (congestive) and diastolic (congestive) heart failure: Secondary | ICD-10-CM

## 2022-08-07 DIAGNOSIS — D128 Benign neoplasm of rectum: Secondary | ICD-10-CM

## 2022-08-07 DIAGNOSIS — I493 Ventricular premature depolarization: Secondary | ICD-10-CM

## 2022-08-07 DIAGNOSIS — I10 Essential (primary) hypertension: Secondary | ICD-10-CM

## 2022-08-07 DIAGNOSIS — M549 Dorsalgia, unspecified: Secondary | ICD-10-CM

## 2022-08-07 DIAGNOSIS — K319 Disease of stomach and duodenum, unspecified: Secondary | ICD-10-CM

## 2022-08-07 MED ORDER — METOPROLOL SUCCINATE 25 MG PO TB24
12.5 mg | ORAL_TABLET | Freq: Two times a day (BID) | ORAL | 1 refills | 90.00000 days | Status: AC
Start: 2022-08-07 — End: ?

## 2022-08-07 NOTE — Patient Instructions
Thank you for visiting our office today.    We would like to make the following medication adjustments:    Stop Coreg and Losartan  Start ToprolXL 12.5mg  twice daily       Otherwise continue the same medications as you have been doing.          We will be pursuing the following tests after your appointment today:       Orders Placed This Encounter    ECG 12-LEAD    2D + DOPPLER ECHO    metoprolol succinate XL (TOPROL XL) 25 mg extended release tablet     Schedule an appointment with heart failure at [phone]     Please call us in the meantime with any questions or concerns.        Please allow 5-7 business days for our providers to review your results. All normal results will go to MyChart. If you do not have Mychart, it is strongly recommended to get this so you can easily view all your results. If you do not have mychart, we will attempt to call you once with normal lab and testing results. If we cannot reach you by phone with normal results, we will send you a letter.  If you have not heard the results of your testing after one week please give [place] a call.       Your Cardiovascular Medicine [place] Team Brett Canales, Pilar Jarvis, Shawna Orleans, and [place])  phone number is [phone].

## 2022-08-15 ENCOUNTER — Encounter: Admit: 2022-08-15 | Discharge: 2022-08-15 | Payer: No Typology Code available for payment source

## 2022-08-15 DIAGNOSIS — Z9889 Other specified postprocedural states: Secondary | ICD-10-CM

## 2022-08-15 DIAGNOSIS — I493 Ventricular premature depolarization: Secondary | ICD-10-CM

## 2022-08-15 DIAGNOSIS — I10 Essential (primary) hypertension: Secondary | ICD-10-CM

## 2022-08-15 DIAGNOSIS — I5033 Acute on chronic diastolic (congestive) heart failure: Secondary | ICD-10-CM

## 2022-08-15 NOTE — Telephone Encounter
Katelyn Wu called into the nurse line and reports that immediately after taking the metoprolol, she has squeezing CP that lasts 4-5 hours.  She states that she also gets SOB, blurred vision and headache.  The pain resolves after she takes a nap.  She reports that her weight is stable at 151 pounds, BP 114/96, HR 100.  Pt states that she had chest pain before starting this medication, but this feels different.    Discussed with Dr. Avie Arenas.  She recommends pt see her PCP for symptom evaluation as she should not have symptoms immediately after taking the medication.  Pt is agreeable to plan and has an appt with PCP next week.  Advised pt to go to the ED for any new or worsening symptoms.  She is agreeable to plan.  Katelyn Wu is worried that she may be starting to accumulate fluid and would like to check her labs.  Faxed lab request to [facility].

## 2022-08-16 ENCOUNTER — Encounter: Admit: 2022-08-16 | Discharge: 2022-08-16 | Payer: No Typology Code available for payment source

## 2022-08-17 MED FILL — COLCHICINE 0.6 MG PO TAB: 0.6 mg | ORAL | 30 days supply | Qty: 60 | Fill #2 | Status: AC

## 2022-08-22 ENCOUNTER — Encounter: Admit: 2022-08-22 | Discharge: 2022-08-22 | Payer: No Typology Code available for payment source

## 2022-09-05 ENCOUNTER — Encounter: Admit: 2022-09-05 | Discharge: 2022-09-05 | Payer: No Typology Code available for payment source

## 2022-09-05 ENCOUNTER — Ambulatory Visit: Admit: 2022-09-05 | Discharge: 2022-09-05 | Payer: No Typology Code available for payment source

## 2022-09-05 DIAGNOSIS — I10 Essential (primary) hypertension: Secondary | ICD-10-CM

## 2022-09-05 DIAGNOSIS — Z9889 Other specified postprocedural states: Secondary | ICD-10-CM

## 2022-09-05 DIAGNOSIS — I5032 Chronic diastolic (congestive) heart failure: Secondary | ICD-10-CM

## 2022-09-05 DIAGNOSIS — D128 Benign neoplasm of rectum: Secondary | ICD-10-CM

## 2022-09-05 DIAGNOSIS — I5033 Acute on chronic diastolic (congestive) heart failure: Secondary | ICD-10-CM

## 2022-09-05 DIAGNOSIS — I3139 Pericardial effusion: Secondary | ICD-10-CM

## 2022-09-05 DIAGNOSIS — I493 Ventricular premature depolarization: Secondary | ICD-10-CM

## 2022-09-05 MED ORDER — PERFLUTREN LIPID MICROSPHERES 1.1 MG/ML IV SUSP
1-10 mL | Freq: Once | INTRAVENOUS | 0 refills | Status: CP | PRN
Start: 2022-09-05 — End: ?

## 2022-09-05 MED ORDER — SODIUM CHLORIDE 0.9 % IJ SOLN
10 mL | Freq: Once | INTRAVENOUS | 0 refills | Status: CP
Start: 2022-09-05 — End: ?

## 2022-09-11 ENCOUNTER — Encounter: Admit: 2022-09-11 | Discharge: 2022-09-11 | Payer: No Typology Code available for payment source

## 2022-09-11 NOTE — Telephone Encounter
Received call from [facility] with Dr. Isaac Bliss office at [facility].  Katelyn Wu is being scheduled for an EGD and they are requesting cardiac clearance.    Discussed with Dr. Avie Arenas, pt is okay to proceed with procedure from cardiology standpoint.    Faxed note to Dr. Isaac Bliss office.

## 2022-09-15 ENCOUNTER — Encounter: Admit: 2022-09-15 | Discharge: 2022-09-15 | Payer: No Typology Code available for payment source

## 2022-09-15 NOTE — Telephone Encounter
Sent my chart message with results and recommendations.  

## 2022-09-15 NOTE — Telephone Encounter
-----   Message from Selinda Flavin, MD sent at 09/14/2022  7:51 PM CDT -----  Please let the patient know that the echocardiogram showed that her ejection fraction is mildly depressed at 45%, the previous repair mitral valve functions well.  It was no fluid around the heart.    I think this patient would like to follow-up with one of the heart failure specialist, we can set her up in [place] to see Dr. Jeannette Corpus.  ----- Message -----  From: Leilani Merl, MD  Sent: 09/05/2022  11:20 AM CDT  To: Dorris Fetch, MD

## 2022-09-16 ENCOUNTER — Encounter: Admit: 2022-09-16 | Discharge: 2022-09-16 | Payer: No Typology Code available for payment source

## 2022-09-18 ENCOUNTER — Encounter: Admit: 2022-09-18 | Discharge: 2022-09-18 | Payer: No Typology Code available for payment source

## 2022-09-19 MED FILL — BUMETANIDE 1 MG PO TAB: 1 mg | ORAL | 30 days supply | Qty: 60 | Fill #2 | Status: AC

## 2022-10-20 ENCOUNTER — Encounter: Admit: 2022-10-20 | Discharge: 2022-10-20 | Payer: No Typology Code available for payment source

## 2022-10-21 ENCOUNTER — Encounter: Admit: 2022-10-21 | Discharge: 2022-10-21 | Payer: No Typology Code available for payment source

## 2022-10-22 ENCOUNTER — Encounter: Admit: 2022-10-22 | Discharge: 2022-10-22 | Payer: No Typology Code available for payment source

## 2022-10-22 MED FILL — COLCHICINE 0.6 MG PO TAB: 0.6 mg | ORAL | 30 days supply | Qty: 60 | Fill #3 | Status: AC

## 2023-01-16 ENCOUNTER — Encounter: Admit: 2023-01-16 | Discharge: 2023-01-16 | Payer: No Typology Code available for payment source

## 2023-02-06 IMAGING — CT PE(Adult)
2 of 5 series · 12 of 36 positions shown · non-contrast
Comparison: none

[Series 10: cta pulmonary cor 2.00 bv36 s3 · coronal · 0.62mm/px · 3 of 147 slices shown]
[im 30/147  mediastinal]
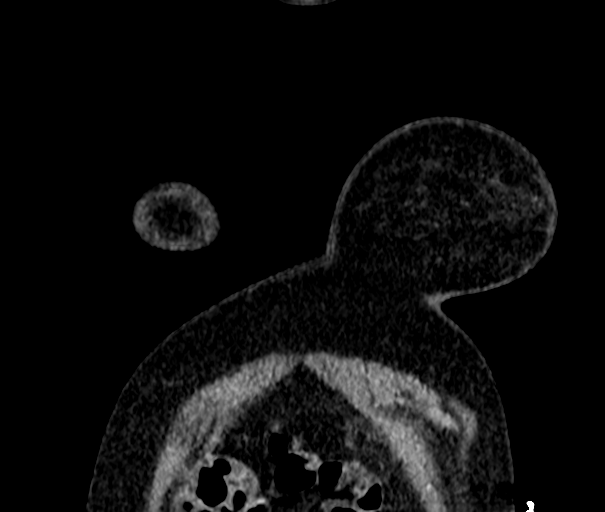
[im 59/147  mediastinal]
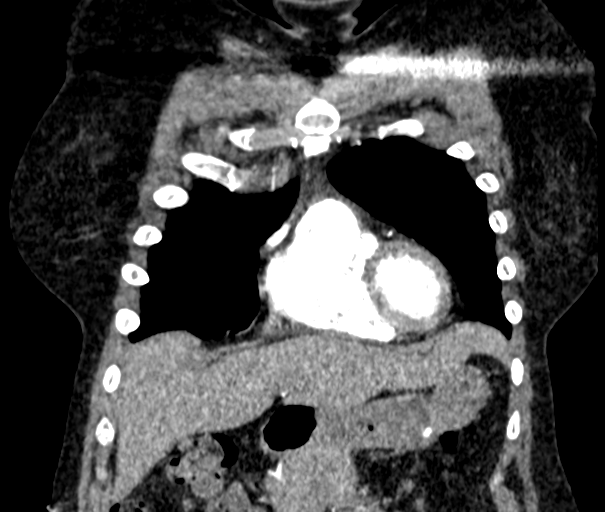
[im 88/147  mediastinal]
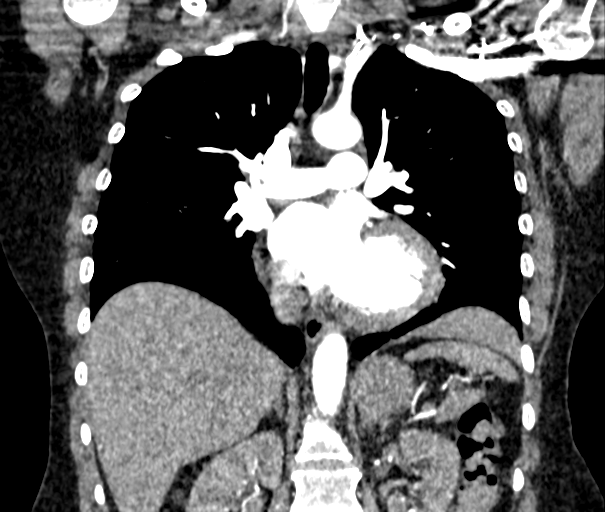

[Series 16: cta pulmonary ax 1.50 br60 s3 · axial · 0.60mm/px · z∈[+1628,+1882]mm · 9 of 197 slices shown]
[im 20/197  lung]
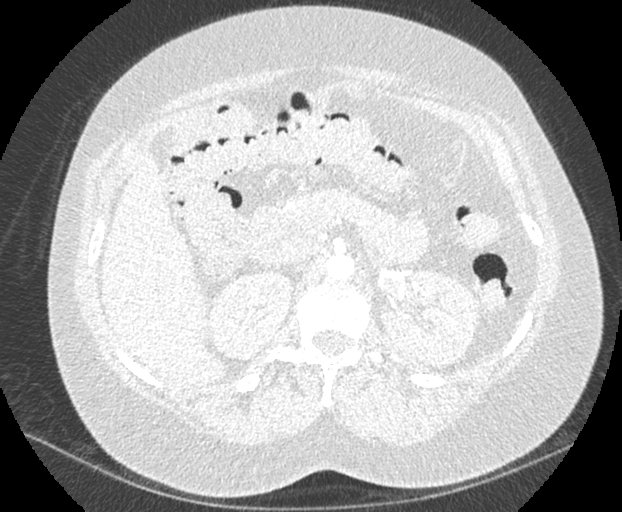
[im 40/197  mediastinal]
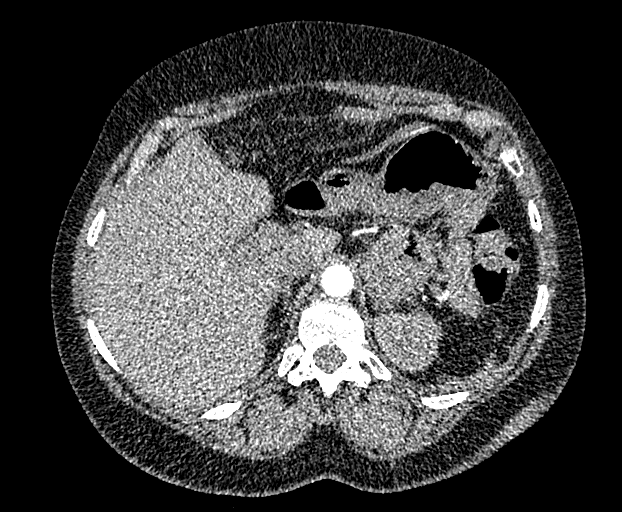
[im 59/197  lung]
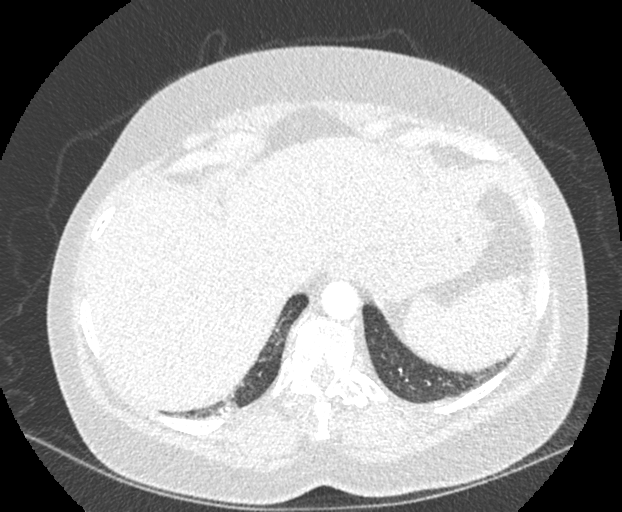
[im 79/197  mediastinal]
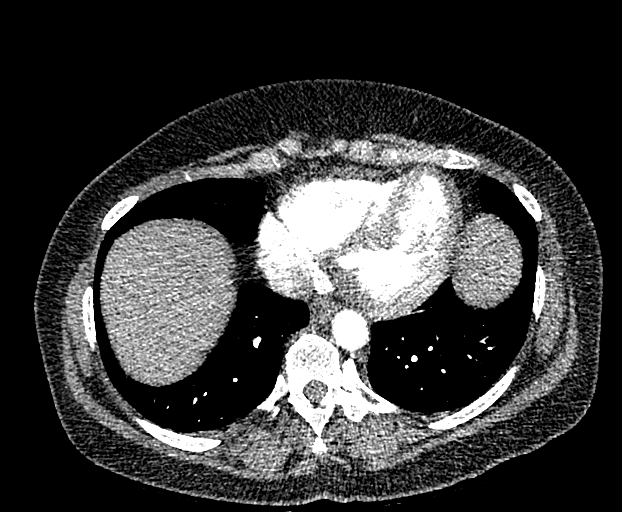
[im 99/197  lung]
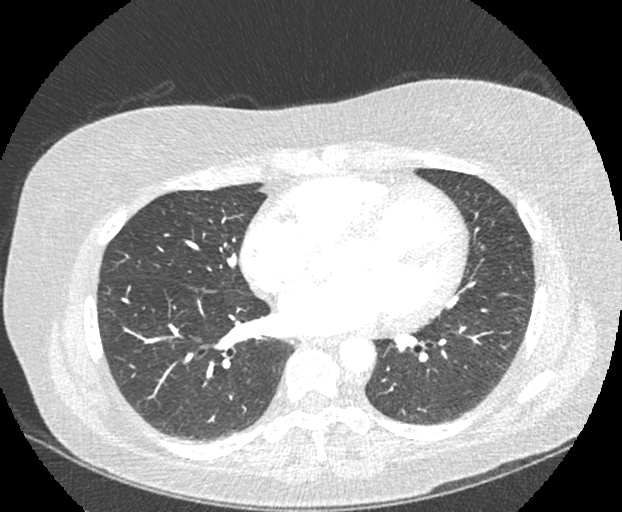
[im 118/197  mediastinal]
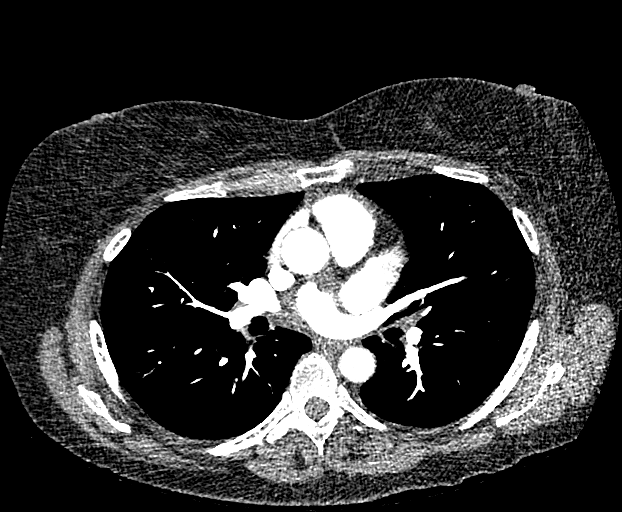
[im 138/197  lung]
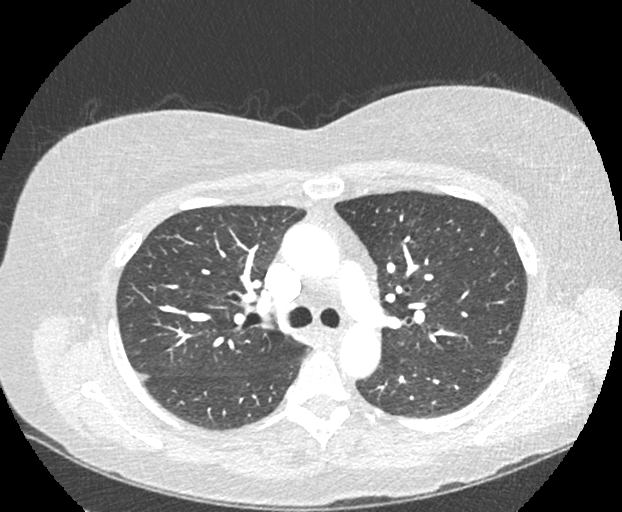
[im 157/197  mediastinal]
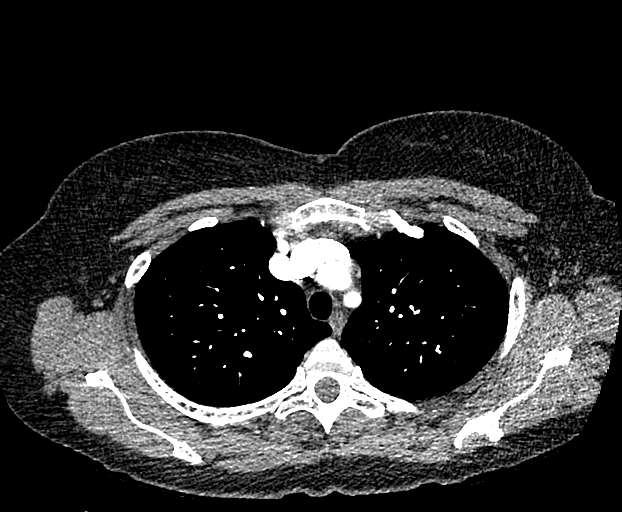
[im 177/197  lung]
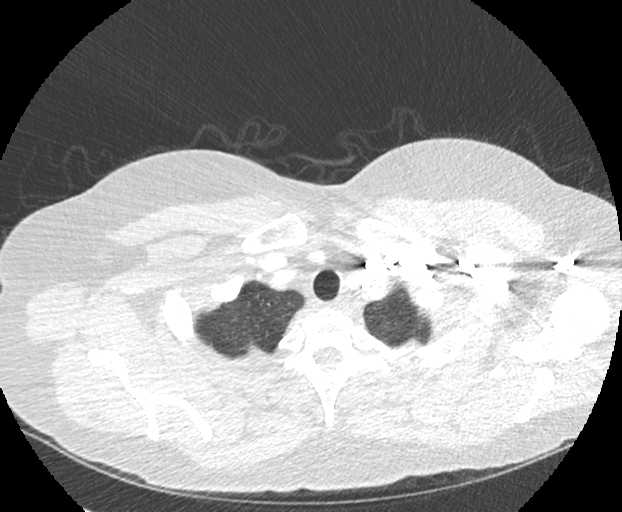

[12 of 36 positions shown; findings below may reference images not displayed]

DIAGNOSTIC STUDIES

EXAM

COMPUTED TOMOGRAPHY, THORAX, WITH CONTRAST MATERIAL CPT 16568

INDICATION

elevated d-dimer, sob
Elevated d-dimer. Pt c/o shortness of breath. H/o COPD. Creat: 1.06. GFR: 60.2 PVU2EFJ 177P8 used
via IV. CT/NM [DATE]. CF .

TECHNIQUE

Contiguous axial tomographic images were obtained from the lung apices to the upper abdomen with
intravenous contrast.  contrast. Coronal and sagittal reformatted images were obtained.

All CT scans at this facility use dose modulation, iterative reconstruction, and/or weight based
dosing when appropriate to reduce radiation dose to as low as reasonably achievable.

0 CT and 0 nuclear scans in the last year.

COMPARISONS

No priors available for comparison.

FINDINGS

The heart appears normal in size. No right heart strain. No pericardial effusion. No aortic
aneurysm or dissection. No definite acute pulmonary embolism. Calcified mediastinal nodes. No
pleural effusion. Mild centrilobular emphysematous changes. No gross pneumothorax. No dense
consolidation. No pneumoperitoneum. Cholecystectomy. The adrenal glands appear grossly unremarkable.
Small nonobstructive left renal calculi. Mild gastric mucosal thickening. No acute fracture.

IMPRESSION

1. No definite acute pulmonary embolism.

2. COPD. No dense consolidation.

3. Mild gastritis.

Tech Notes:

Elevated d-dimer. Pt c/o shortness of breath. H/o COPD. Creat: 1.06. GFR: 60.2 PVU2EFJ 177P8 used
via IV. CT/NM [DATE]. CF

## 2023-02-06 IMAGING — CR [ID]
1 series · 1 of 1 positions shown · non-contrast
Comparison: none

[x chest ap]
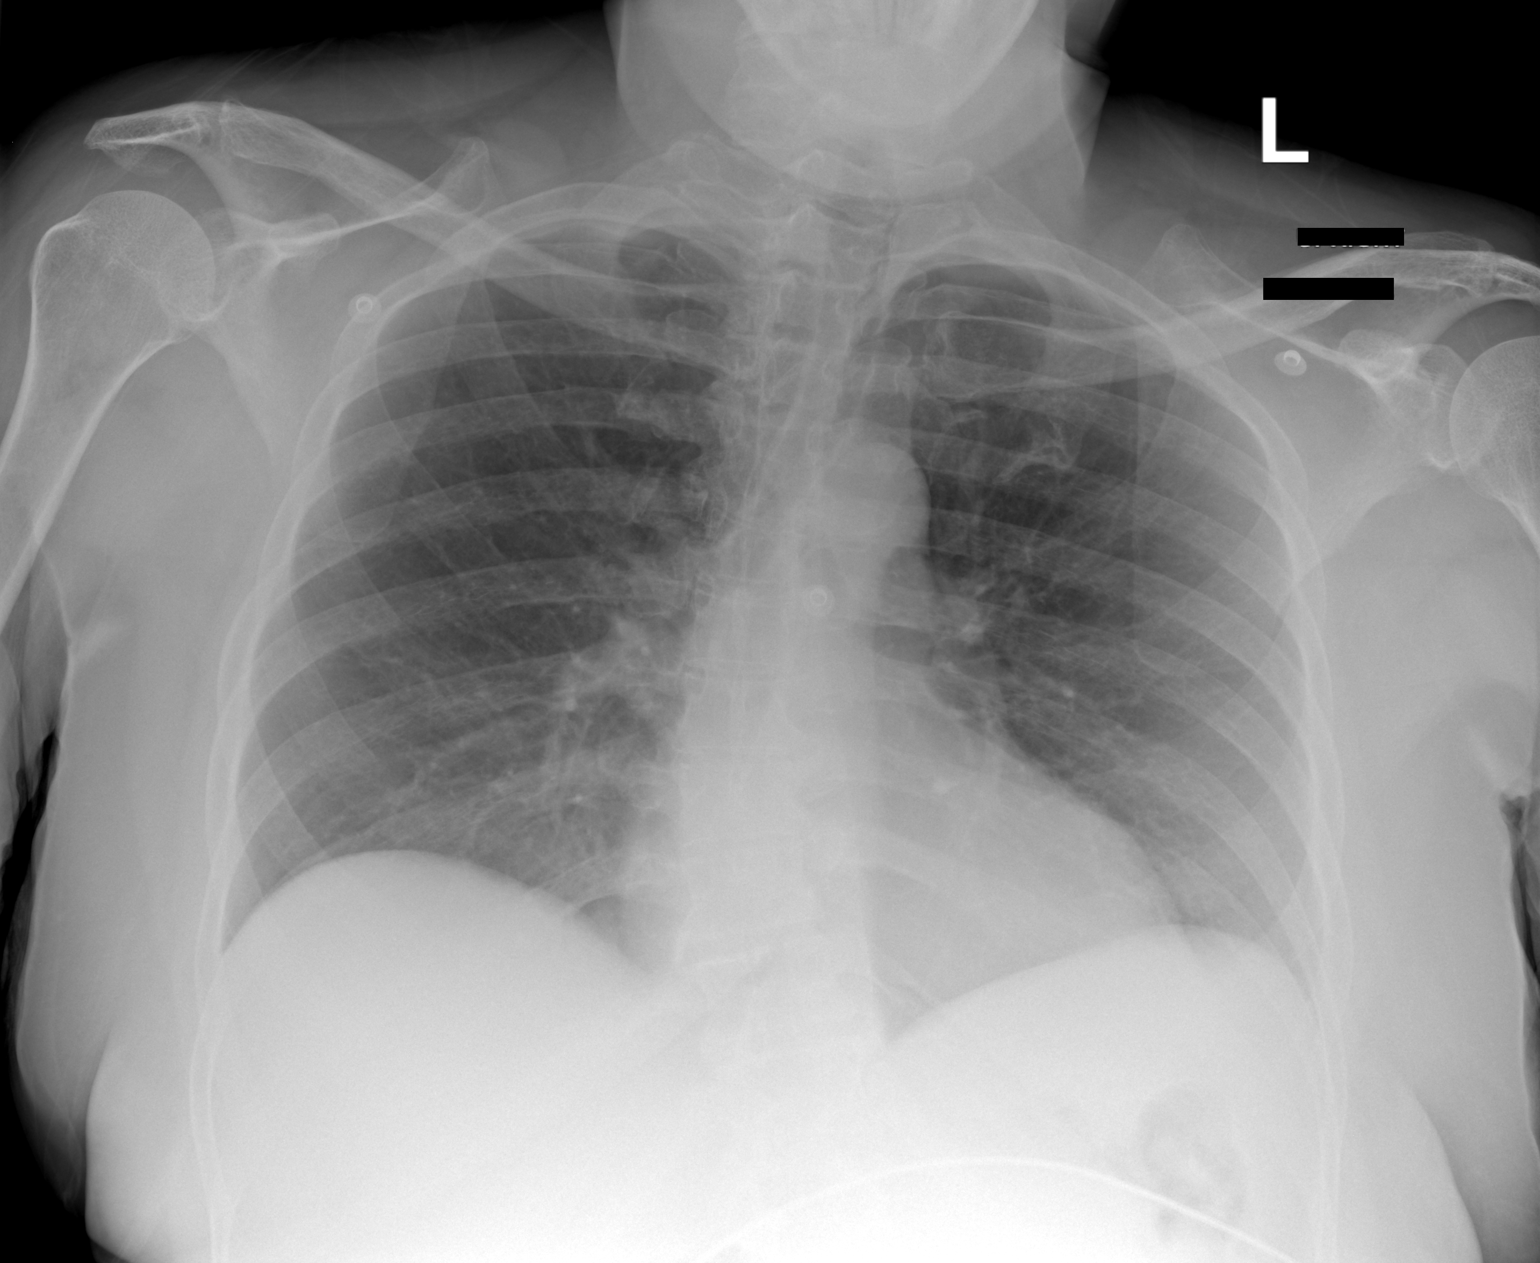

[1 of 1 positions shown; findings below may reference images not displayed]

EXAM

RADIOLOGICAL EXAMINATION, CHEST; SINGLE VIEW, FRONTAL CPT 85757

INDICATION

sob
Pt c/o shortness of breath. H/o COPD. CF

TECHNIQUE

Single PA portable view of the chest was performed.

COMPARISONS

05/21/2020

FINDINGS

The heart appears normal in size. COPD. Mild bilateral hilar prominence. Mild peribronchial
thickening. No gross pneumothorax. No dense consolidation. No acute fracture. Limited evaluation of
the thoracic spine.

IMPRESSION

1. COPD with mild peribronchial thickening. No dense consolidation.

Tech Notes:

Pt c/o shortness of breath. H/o COPD. CF

## 2023-03-04 ENCOUNTER — Encounter: Admit: 2023-03-04 | Discharge: 2023-03-04

## 2023-04-17 ENCOUNTER — Encounter: Admit: 2023-04-17 | Discharge: 2023-04-17

## 2023-06-04 ENCOUNTER — Encounter: Admit: 2023-06-04 | Discharge: 2023-06-04

## 2023-12-21 ENCOUNTER — Encounter: Admit: 2023-12-21 | Discharge: 2023-12-21 | Payer: PRIVATE HEALTH INSURANCE

## 2023-12-31 ENCOUNTER — Encounter: Admit: 2023-12-31 | Discharge: 2023-12-31 | Payer: PRIVATE HEALTH INSURANCE

## 2024-03-27 ENCOUNTER — Encounter: Admit: 2024-03-27 | Discharge: 2024-03-27 | Payer: Commercial Managed Care - Pharmacy Benefit Manager

## 2024-05-06 ENCOUNTER — Encounter: Admit: 2024-05-06 | Discharge: 2024-05-06 | Payer: Commercial Managed Care - Pharmacy Benefit Manager
